# Patient Record
Sex: Female | Born: 1985 | Hispanic: Yes | Marital: Single | Smoking: Never smoker
Health system: Southern US, Community
[De-identification: ages and names within clinical notes are randomized; demographics above are authoritative.]

## PROBLEM LIST (undated history)

## (undated) DIAGNOSIS — D649 Anemia, unspecified: Secondary | ICD-10-CM

## (undated) DIAGNOSIS — Z8759 Personal history of other complications of pregnancy, childbirth and the puerperium: Secondary | ICD-10-CM

## (undated) DIAGNOSIS — Z789 Other specified health status: Secondary | ICD-10-CM

## (undated) HISTORY — DX: Personal history of other complications of pregnancy, childbirth and the puerperium: Z87.59

## (undated) HISTORY — PX: NO PAST SURGERIES: SHX2092

## (undated) HISTORY — DX: Anemia, unspecified: D64.9

---

## 2005-01-18 ENCOUNTER — Ambulatory Visit: Payer: Self-pay | Admitting: Family Medicine

## 2005-05-10 ENCOUNTER — Ambulatory Visit: Payer: Self-pay | Admitting: Family Medicine

## 2005-05-25 ENCOUNTER — Inpatient Hospital Stay: Payer: Self-pay | Admitting: Obstetrics and Gynecology

## 2007-12-03 ENCOUNTER — Inpatient Hospital Stay: Payer: Self-pay

## 2013-07-16 ENCOUNTER — Emergency Department: Payer: Self-pay | Admitting: Emergency Medicine

## 2013-07-16 LAB — URINALYSIS, COMPLETE
BILIRUBIN, UR: NEGATIVE
Bacteria: NONE SEEN
Blood: NEGATIVE
GLUCOSE, UR: NEGATIVE mg/dL (ref 0–75)
Leukocyte Esterase: NEGATIVE
Nitrite: NEGATIVE
PH: 6 (ref 4.5–8.0)
PROTEIN: NEGATIVE
RBC,UR: <1 /HPF (ref 0–5)
SPECIFIC GRAVITY: 1.013 (ref 1.003–1.030)
Squamous Epithelial: <1
WBC UR: 2 /HPF (ref 0–5)

## 2013-07-16 LAB — BASIC METABOLIC PANEL
ANION GAP: 11 (ref 7–16)
BUN: 7 mg/dL (ref 7–18)
CO2: 21 mmol/L (ref 21–32)
Calcium, Total: 8.5 mg/dL (ref 8.5–10.1)
Chloride: 107 mmol/L (ref 98–107)
Creatinine: 0.57 mg/dL — ABNORMAL LOW (ref 0.60–1.30)
EGFR (African American): >60
EGFR (Non-African Amer.): >60
Glucose: 92 mg/dL (ref 65–99)
Osmolality: 275 (ref 275–301)
Potassium: 3.4 mmol/L — ABNORMAL LOW (ref 3.5–5.1)
Sodium: 139 mmol/L (ref 136–145)

## 2013-07-16 LAB — CBC
HCT: 38.1 % (ref 35.0–47.0)
HGB: 12.9 g/dL (ref 12.0–16.0)
MCH: 31 pg (ref 26.0–34.0)
MCHC: 33.9 g/dL (ref 32.0–36.0)
MCV: 91 fL (ref 80–100)
PLATELETS: 246 10*3/uL (ref 150–440)
RBC: 4.17 10*6/uL (ref 3.80–5.20)
RDW: 13.8 % (ref 11.5–14.5)
WBC: 13 10*3/uL — ABNORMAL HIGH (ref 3.6–11.0)

## 2013-07-16 LAB — GC/CHLAMYDIA PROBE AMP

## 2013-07-16 LAB — HCG, QUANTITATIVE, PREGNANCY: Beta Hcg, Quant.: 5035 m[IU]/mL — ABNORMAL HIGH

## 2013-07-16 LAB — WET PREP, GENITAL

## 2013-10-24 ENCOUNTER — Ambulatory Visit: Payer: Self-pay | Admitting: Advanced Practice Midwife

## 2014-03-10 ENCOUNTER — Inpatient Hospital Stay: Payer: Self-pay | Admitting: Obstetrics and Gynecology

## 2014-03-10 LAB — CBC WITH DIFFERENTIAL/PLATELET
Basophil #: 0 10*3/uL (ref 0.0–0.1)
Basophil %: 0.3 %
Eosinophil #: 0 10*3/uL (ref 0.0–0.7)
Eosinophil %: 0.1 %
HCT: 35.4 % (ref 35.0–47.0)
HGB: 11.2 g/dL — ABNORMAL LOW (ref 12.0–16.0)
LYMPHS PCT: 11.9 %
Lymphocyte #: 2 10*3/uL (ref 1.0–3.6)
MCH: 28 pg (ref 26.0–34.0)
MCHC: 31.5 g/dL — AB (ref 32.0–36.0)
MCV: 89 fL (ref 80–100)
Monocyte #: 1 x10 3/mm — ABNORMAL HIGH (ref 0.2–0.9)
Monocyte %: 6.2 %
Neutrophil #: 13.7 10*3/uL — ABNORMAL HIGH (ref 1.4–6.5)
Neutrophil %: 81.5 %
PLATELETS: 245 10*3/uL (ref 150–440)
RBC: 3.99 10*6/uL (ref 3.80–5.20)
RDW: 15.8 % — AB (ref 11.5–14.5)
WBC: 16.9 10*3/uL — ABNORMAL HIGH (ref 3.6–11.0)

## 2014-03-11 LAB — GC/CHLAMYDIA PROBE AMP

## 2014-03-11 LAB — HEMOGLOBIN: HGB: 10.2 g/dL — ABNORMAL LOW (ref 12.0–16.0)

## 2014-08-11 ENCOUNTER — Ambulatory Visit
Admission: RE | Admit: 2014-08-11 | Discharge: 2014-08-11 | Disposition: A | Discharge status: Home / Self Care | Payer: Self-pay | Source: Ambulatory Visit | Attending: Advanced Practice Midwife | Admitting: Advanced Practice Midwife

## 2014-08-11 ENCOUNTER — Other Ambulatory Visit: Payer: Self-pay | Admitting: Advanced Practice Midwife

## 2014-08-11 DIAGNOSIS — Z3481 Encounter for supervision of other normal pregnancy, first trimester: Secondary | ICD-10-CM

## 2014-08-11 DIAGNOSIS — Z36 Encounter for antenatal screening of mother: Secondary | ICD-10-CM | POA: Insufficient documentation

## 2014-08-11 DIAGNOSIS — R935 Abnormal findings on diagnostic imaging of other abdominal regions, including retroperitoneum: Secondary | ICD-10-CM | POA: Insufficient documentation

## 2014-08-11 DIAGNOSIS — Z3A00 Weeks of gestation of pregnancy not specified: Secondary | ICD-10-CM | POA: Insufficient documentation

## 2015-09-29 ENCOUNTER — Other Ambulatory Visit: Payer: Self-pay | Admitting: Family Medicine

## 2015-09-29 DIAGNOSIS — Z3482 Encounter for supervision of other normal pregnancy, second trimester: Secondary | ICD-10-CM

## 2015-09-30 LAB — OB RESULTS CONSOLE GC/CHLAMYDIA: CHLAMYDIA, DNA PROBE: NEGATIVE

## 2015-09-30 LAB — OB RESULTS CONSOLE HEPATITIS B SURFACE ANTIGEN: HEP B S AG: NEGATIVE

## 2015-09-30 LAB — OB RESULTS CONSOLE RPR: RPR: NONREACTIVE

## 2015-09-30 LAB — OB RESULTS CONSOLE HIV ANTIBODY (ROUTINE TESTING): HIV: NONREACTIVE

## 2015-10-26 ENCOUNTER — Ambulatory Visit
Admission: RE | Admit: 2015-10-26 | Discharge: 2015-10-26 | Disposition: A | Discharge status: Home / Self Care | Payer: Self-pay | Source: Ambulatory Visit | Attending: Family Medicine | Admitting: Family Medicine

## 2015-10-26 DIAGNOSIS — Z3A18 18 weeks gestation of pregnancy: Secondary | ICD-10-CM | POA: Insufficient documentation

## 2015-10-26 DIAGNOSIS — Z3482 Encounter for supervision of other normal pregnancy, second trimester: Secondary | ICD-10-CM | POA: Insufficient documentation

## 2016-03-07 NOTE — L&D Delivery Note (Addendum)
Delivery Note  Pt admitted in active labor. AROM for clear fluid at 0648.  At 7:54 AM a viable and healthy female "Clarissa" was delivered via Vaginal, Spontaneous Delivery, in LOA position with   APGAR: 8, 8 ; weight 7 lb 3 oz (3260 g).   Placenta delivered spontaneously. Fundal massage required for firmness and clots extracted from lower uterine segment x3. 800mcg cytotec placed rectally. Final quantitative blood loss >500  Anesthesia:  none Episiotomy:  none Lacerations:  Right periurethral, hemostatic and small Est. Blood Loss (mL): 700  Mom to postpartum.  Baby to Couplet care / Skin to Skin.  Breastfeeding  Ellanor Feuerstein 03/24/2016, 8:12 AM

## 2016-03-24 ENCOUNTER — Inpatient Hospital Stay
Admission: EM | Admit: 2016-03-24 | Discharge: 2016-03-25 | DRG: 774 | Disposition: A | Discharge status: Home / Self Care | Payer: Medicaid Other | Attending: Obstetrics and Gynecology | Admitting: Obstetrics and Gynecology

## 2016-03-24 DIAGNOSIS — D62 Acute posthemorrhagic anemia: Secondary | ICD-10-CM | POA: Diagnosis not present

## 2016-03-24 DIAGNOSIS — Z6790 Unspecified blood type, Rh positive: Secondary | ICD-10-CM

## 2016-03-24 DIAGNOSIS — D649 Anemia, unspecified: Secondary | ICD-10-CM | POA: Diagnosis present

## 2016-03-24 DIAGNOSIS — O9081 Anemia of the puerperium: Secondary | ICD-10-CM | POA: Diagnosis not present

## 2016-03-24 DIAGNOSIS — O99019 Anemia complicating pregnancy, unspecified trimester: Secondary | ICD-10-CM | POA: Diagnosis present

## 2016-03-24 DIAGNOSIS — Z3490 Encounter for supervision of normal pregnancy, unspecified, unspecified trimester: Secondary | ICD-10-CM

## 2016-03-24 DIAGNOSIS — O26893 Other specified pregnancy related conditions, third trimester: Secondary | ICD-10-CM | POA: Diagnosis present

## 2016-03-24 DIAGNOSIS — Z3A39 39 weeks gestation of pregnancy: Secondary | ICD-10-CM

## 2016-03-24 DIAGNOSIS — Z3493 Encounter for supervision of normal pregnancy, unspecified, third trimester: Secondary | ICD-10-CM | POA: Diagnosis present

## 2016-03-24 HISTORY — DX: Other specified health status: Z78.9

## 2016-03-24 LAB — CBC
HCT: 33.7 % — ABNORMAL LOW (ref 35.0–47.0)
Hemoglobin: 11.6 g/dL — ABNORMAL LOW (ref 12.0–16.0)
MCH: 29.5 pg (ref 26.0–34.0)
MCHC: 34.4 g/dL (ref 32.0–36.0)
MCV: 85.7 fL (ref 80.0–100.0)
PLATELETS: 304 10*3/uL (ref 150–440)
RBC: 3.93 MIL/uL (ref 3.80–5.20)
RDW: 16.9 % — AB (ref 11.5–14.5)
WBC: 14 10*3/uL — ABNORMAL HIGH (ref 3.6–11.0)

## 2016-03-24 LAB — TYPE AND SCREEN
ABO/RH(D): O POS
ANTIBODY SCREEN: NEGATIVE

## 2016-03-24 MED ORDER — ACETAMINOPHEN 325 MG PO TABS: 650.0000 mg | ORAL_TABLET | ORAL | Status: DC | PRN

## 2016-03-24 MED ORDER — PRENATAL MULTIVITAMIN CH
1.0000 | ORAL_TABLET | Freq: Every day | ORAL | Status: DC
  Administered 2016-03-24 – 2016-03-25 (×2): 1 via ORAL
  Filled 2016-03-24 (×2): qty 1

## 2016-03-24 MED ORDER — OXYTOCIN 10 UNIT/ML IJ SOLN
INTRAMUSCULAR | Status: AC
  Filled 2016-03-24: qty 2

## 2016-03-24 MED ORDER — DIPHENHYDRAMINE HCL 25 MG PO CAPS
25.0000 mg | ORAL_CAPSULE | Freq: Four times a day (QID) | ORAL | Status: DC | PRN
Start: 2016-03-24 — End: 2016-03-25

## 2016-03-24 MED ORDER — SIMETHICONE 80 MG PO CHEW: 80.0000 mg | CHEWABLE_TABLET | ORAL | Status: DC | PRN

## 2016-03-24 MED ORDER — MISOPROSTOL 200 MCG PO TABS
ORAL_TABLET | ORAL | Status: AC
  Administered 2016-03-24: 800 ug via RECTAL
  Filled 2016-03-24: qty 4

## 2016-03-24 MED ORDER — ZOLPIDEM TARTRATE 5 MG PO TABS: 5.0000 mg | ORAL_TABLET | Freq: Every evening | ORAL | Status: DC | PRN

## 2016-03-24 MED ORDER — METHYLERGONOVINE MALEATE 0.2 MG/ML IJ SOLN
INTRAMUSCULAR | Status: AC
  Filled 2016-03-24: qty 1

## 2016-03-24 MED ORDER — BUTORPHANOL TARTRATE 1 MG/ML IJ SOLN
1.0000 mg | INTRAMUSCULAR | Status: DC | PRN
  Administered 2016-03-24: 1 mg via INTRAVENOUS
  Filled 2016-03-24: qty 1

## 2016-03-24 MED ORDER — FLEET ENEMA 7-19 GM/118ML RE ENEM: 1.0000 | ENEMA | Freq: Every day | RECTAL | Status: DC | PRN

## 2016-03-24 MED ORDER — COCONUT OIL OIL
1.0000 | TOPICAL_OIL | Status: DC | PRN
Start: 2016-03-24 — End: 2016-03-25
  Filled 2016-03-24: qty 120

## 2016-03-24 MED ORDER — OXYCODONE HCL 5 MG PO TABS
5.0000 mg | ORAL_TABLET | ORAL | Status: DC | PRN
  Administered 2016-03-24: 5 mg via ORAL
  Filled 2016-03-24: qty 1

## 2016-03-24 MED ORDER — ONDANSETRON HCL 4 MG PO TABS: 4.0000 mg | ORAL_TABLET | ORAL | Status: DC | PRN

## 2016-03-24 MED ORDER — SODIUM CHLORIDE FLUSH 0.9 % IV SOLN
INTRAVENOUS | Status: AC
  Filled 2016-03-24: qty 10

## 2016-03-24 MED ORDER — SENNOSIDES-DOCUSATE SODIUM 8.6-50 MG PO TABS
2.0000 | ORAL_TABLET | ORAL | Status: DC
  Administered 2016-03-25: 2 via ORAL
  Filled 2016-03-24: qty 2

## 2016-03-24 MED ORDER — AMMONIA AROMATIC IN INHA
RESPIRATORY_TRACT | Status: AC
  Filled 2016-03-24: qty 10

## 2016-03-24 MED ORDER — OXYTOCIN 40 UNITS IN LACTATED RINGERS INFUSION - SIMPLE MED
INTRAVENOUS | Status: AC
  Administered 2016-03-24: 08:00:00
  Filled 2016-03-24: qty 1000

## 2016-03-24 MED ORDER — LACTATED RINGERS IV SOLN
INTRAVENOUS | Status: DC
  Administered 2016-03-24: 06:00:00 via INTRAVENOUS

## 2016-03-24 MED ORDER — LIDOCAINE HCL (PF) 1 % IJ SOLN
INTRAMUSCULAR | Status: AC
  Filled 2016-03-24: qty 30

## 2016-03-24 MED ORDER — DIBUCAINE 1 % RE OINT: 1.0000 "application " | TOPICAL_OINTMENT | RECTAL | Status: DC | PRN

## 2016-03-24 MED ORDER — LACTATED RINGERS IV SOLN: 500.0000 mL | INTRAVENOUS | Status: DC | PRN

## 2016-03-24 MED ORDER — TETANUS-DIPHTH-ACELL PERTUSSIS 5-2.5-18.5 LF-MCG/0.5 IM SUSP: 0.5000 mL | Freq: Once | INTRAMUSCULAR | Status: DC

## 2016-03-24 MED ORDER — SODIUM CHLORIDE 0.9% FLUSH: 3.0000 mL | INTRAVENOUS | Status: DC | PRN

## 2016-03-24 MED ORDER — BENZOCAINE-MENTHOL 20-0.5 % EX AERO: 1.0000 "application " | INHALATION_SPRAY | CUTANEOUS | Status: DC | PRN

## 2016-03-24 MED ORDER — MEASLES, MUMPS & RUBELLA VAC ~~LOC~~ INJ
0.5000 mL | INJECTION | Freq: Once | SUBCUTANEOUS | Status: DC
  Filled 2016-03-24 (×2): qty 0.5

## 2016-03-24 MED ORDER — SODIUM CHLORIDE 0.9 % IV SOLN: 250.0000 mL | INTRAVENOUS | Status: DC | PRN

## 2016-03-24 MED ORDER — ONDANSETRON HCL 4 MG/2ML IJ SOLN: 4.0000 mg | INTRAMUSCULAR | Status: DC | PRN

## 2016-03-24 MED ORDER — MISOPROSTOL 200 MCG PO TABS
800.0000 ug | ORAL_TABLET | Freq: Once | ORAL | Status: AC
  Administered 2016-03-24: 800 ug via RECTAL

## 2016-03-24 MED ORDER — IBUPROFEN 600 MG PO TABS
600.0000 mg | ORAL_TABLET | Freq: Four times a day (QID) | ORAL | Status: DC
  Administered 2016-03-24 – 2016-03-25 (×4): 600 mg via ORAL
  Filled 2016-03-24 (×4): qty 1

## 2016-03-24 MED ORDER — SODIUM CHLORIDE 0.9% FLUSH: 3.0000 mL | Freq: Two times a day (BID) | INTRAVENOUS | Status: DC

## 2016-03-24 MED ORDER — WITCH HAZEL-GLYCERIN EX PADS: 1.0000 "application " | MEDICATED_PAD | CUTANEOUS | Status: DC | PRN

## 2016-03-24 MED ORDER — BISACODYL 10 MG RE SUPP: 10.0000 mg | Freq: Every day | RECTAL | Status: DC | PRN

## 2016-03-24 NOTE — H&P (Signed)
OB ADMISSION/ HISTORY & PHYSICAL:  Admission Date: 03/24/2016  2:52 AM  Admit Diagnosis: Contractions  Kaitlin Hanson is a 31 y.o. [id number] at 3439+4wks female presenting for contractions at term. Baby girl "Clarissa"- contractions started at 0100  Prenatal History: [id number]   EDC : 03/27/2016, by Patient Reported  Prenatal care at [facility] Provider: [facility] Prenatal course complicated by Anemia, late entry to care  Prenatal Labs: ABO, Rh: --/--/PENDING (01/18 19140611) - O positive Antibody: PENDING (01/18 [id number]) Negative Rubella:   immune RPR: Nonreactive (07/26 0000)  HBsAg: Negative (07/26 0000)  HIV: Non-reactive (07/26 0000)  GBS:   negative Medical / Surgical History :  Past medical history:  Past Medical History:  Diagnosis Date  . Medical history non-contributory      Past surgical history: History reviewed. No pertinent surgical history.  Family History: History reviewed. No pertinent family history.   Social History:  reports that she has never smoked. She has never used smokeless tobacco. She reports that she does not drink alcohol or use drugs.   Allergies: Patient has no allergy information on record.    Current Medications at time of admission:  Prior to Admission medications   Medication Sig Start Date End Date Taking? Authorizing Provider  ferrous sulfate 325 (65 FE) MG EC tablet Take 325 mg by mouth 3 (three) times daily with meals.   Yes Historical Provider, MD  Prenatal Vit-Fe Fumarate-FA (PRENATAL MULTIVITAMIN) TABS tablet Take 1 tablet by mouth daily at 12 noon.   Yes Historical Provider, MD     Review of Systems: Active FM onset of ctx @ 0100 currently every 2-4 minutes AROM at 0645 for scant bloody clear fluid bloody show    Physical Exam:  VS: Blood pressure 122/83, pulse 91, temperature 97.9 F (36.6 C), temperature source Oral, resp. rate 20, height 5\' 4"  (1.626 m), weight 172 lb (78 kg).  General: alert and oriented, appears  NAD Heart: RRR Lungs: Clear lung fields Abdomen: Gravid, soft and non-tender, non-distended / uterus: 7.5lbs Extremities: no edema  Genitalia / VE: Dilation: 6 Effacement (%): 80 Station: -2, -1 Exam by:: Cianni Manny   FHR: baseline rate 140 / variability moderate / accelerations positive / negative decelerations TOCO: q2-414min  Last US 18wks wks anatomy scan: AF wnl, normal anatomy  Assessment: [redacted] weeks gestation first stage of labor FHR category I   Plan:    ASSESSMENT AND PLAN:  Admit for active labor Labs pending Epidural when desired Continuous fetal monitoring   1. Fetal Well being  - Fetal Tracing: Cat I - Ultrasound:  reviewed, as above - Group B Streptococcus: neg - Presentation: vtx confirmed by sutures   2. Routine OB: - Prenatal labs reviewed, as above - Rh positive  3.  Labor:  -  Contractions; external toco in place -  Pelvis proven to 7LBS  4. Post Partum Planning: - Infant feeding: BOTH

## 2016-03-24 NOTE — OB Triage Note (Signed)
Patient came in today c/o contractions. Reports that contractions began 2 days ago but starting at 1am today contractions were stronger and more consistent. Denies vaginal discharge and vaginal bleeding.

## 2016-03-24 NOTE — Progress Notes (Signed)
Dr. Dalbert GarnetBeasley paged at 0354 to give patient triage report. Verbal orders given to recheck vaginal exam in 2 hours (5:20am) and for patient to ambulate. Intermittent fetal monitoring permission given while ambulating. Patient given plan of care and verbalized understatement/agreement. Patient currently off the monitor and will reapply at 0500.

## 2016-03-25 DIAGNOSIS — O99019 Anemia complicating pregnancy, unspecified trimester: Secondary | ICD-10-CM | POA: Diagnosis present

## 2016-03-25 LAB — CBC
HCT: 29.7 % — ABNORMAL LOW (ref 35.0–47.0)
HEMOGLOBIN: 9.7 g/dL — AB (ref 12.0–16.0)
MCH: 28.1 pg (ref 26.0–34.0)
MCHC: 32.8 g/dL (ref 32.0–36.0)
MCV: 85.8 fL (ref 80.0–100.0)
Platelets: 265 10*3/uL (ref 150–440)
RBC: 3.46 MIL/uL — AB (ref 3.80–5.20)
RDW: 16.4 % — ABNORMAL HIGH (ref 11.5–14.5)
WBC: 11.8 10*3/uL — ABNORMAL HIGH (ref 3.6–11.0)

## 2016-03-25 LAB — RPR: RPR: NONREACTIVE

## 2016-03-25 MED ORDER — IBUPROFEN 600 MG PO TABS: 600.0000 mg | ORAL_TABLET | Freq: Four times a day (QID) | ORAL | 0 refills | Status: DC

## 2016-03-25 NOTE — Discharge Instructions (Signed)
Lactancia materna (Breastfeeding) Decidir Museum/gallery exhibitions officer es una de las mejores elecciones que puede hacer por usted y su beb. El cambio hormonal durante el Psychiatrist produce el desarrollo del tejido mamario y [place] la cantidad y el tamao de los conductos galactforos. Estas hormonas tambin permiten que las protenas, los azcares y las grasas de la sangre produzcan la [employer] materna en las glndulas productoras de [place]. Las hormonas impiden que la leche materna sea liberada antes del nacimiento del beb, adems de impulsar el flujo de leche luego del nacimiento. Una vez que ha comenzado a Museum/gallery exhibitions officer, Conservation officer, nature beb, as Immunologist succin o Theatre manager, pueden estimular la liberacin de [place] de las glndulas productoras de [place]. LOS BENEFICIOS DE AMAMANTAR Para el beb   La primera leche (calostro) ayuda a Careers information officer funcionamiento del sistema digestivo del beb.  La leche tiene anticuerpos que ayudan a Radio producer las infecciones en el beb.  El beb tiene una menor incidencia de asma, alergias y del sndrome de muerte sbita del lactante.  Los nutrientes en la [place] materna son mejores para el beb que la Lyman maternizada y estn preparados exclusivamente para cubrir las necesidades del beb.  La leche materna mejora el desarrollo cerebral del beb.  Es menos probable que el beb desarrolle otras enfermedades, como obesidad infantil, asma o diabetes mellitus de tipo 2. Para usted   La lactancia materna favorece el desarrollo de un vnculo muy especial entre la madre y el beb.  Es conveniente. La leche materna siempre est disponible a la Human resources officer y es [place].  La lactancia materna ayuda a quemar caloras y a perder el peso ganado durante el [place].  Favorece la contraccin del tero al tamao que tena antes del embarazo de manera ms rpida y disminuye el sangrado (loquios) despus del parto.  La lactancia materna contribuye a reducir Nurse, adult de desarrollar diabetes  mellitus de tipo 2, osteoporosis o cncer de mama o de ovario en el futuro. SIGNOS DE QUE EL BEB EST HAMBRIENTO Primeros signos de [address] de [place].  Se estira.  Mueve la cabeza de un lado a otro.  Mueve la cabeza y abre la boca cuando se le toca la mejilla o la comisura de la boca (reflejo de bsqueda).  Aumenta las vocalizaciones, tales como sonidos de succin, se relame los labios, emite arrullos, suspiros, o chirridos.  Mueve la [employer] boca.  Se chupa con ganas los dedos o las manos. Signos tardos de [employer].  Llora de manera intermitente. Signos de [employer] signos de hambre extrema requerirn que lo calme y lo consuele antes de que el beb pueda alimentarse adecuadamente. No espere a que se manifiesten los siguientes signos de hambre extrema para comenzar a Museum/gallery exhibitions officer:  Designer, jewellery.  Llanto intenso y fuerte.  Gritos. INFORMACIN BSICA SOBRE LA LACTANCIA MATERNA Iniciacin de la lactancia materna   Encuentre un lugar cmodo para sentarse o acostarse, con un buen respaldo para el cuello y la espalda.  Coloque una almohada o una manta enrollada debajo del beb para acomodarlo a la altura de la mama (si est sentada). Las almohadas para Museum/gallery exhibitions officer se han diseado especialmente a fin de servir de apoyo para los brazos y el beb [employer].  Asegrese de que el abdomen del beb est frente al suyo.  Masajee suavemente la mama. Con las yemas de los dedos, masajee la pared del pecho hacia el pezn en un  movimiento circular. Esto estimula el flujo de [place]. Es posible que Engineer, manufacturing systemsdeba continuar este movimiento mientras amamanta si la leche fluye lentamente.  Sostenga la mama con el pulgar por arriba del pezn y los otros 4 dedos por debajo de la mama. Asegrese de que los dedos se encuentren lejos del pezn y de la boca del beb.  Empuje suavemente los labios del beb con el pezn o con el dedo.  Cuando la boca  del beb se abra lo suficiente, acrquelo rpidamente a la mama e introduzca todo el pezn y la zona oscura que lo rodea (areola), tanto como sea posible, dentro de la boca del beb.  Debe haber ms areola visible por arriba del labio superior del beb que por debajo del labio inferior.  La lengua del beb debe estar entre la enca inferior y la [place].  Asegrese de que la boca del beb est en la posicin correcta alrededor del pezn (prendida). Los labios del beb deben crear un sello sobre la mama y estar doblados hacia afuera (invertidos).  Es comn que el beb succione durante 2 a 3 minutos para que comience el flujo de [place] materna. Cmo debe prenderse  Es muy importante que le ensee al beb cmo prenderse adecuadamente a la mama. Si el beb no se prende adecuadamente, puede causarle dolor en el pezn y reducir la produccin de [place] materna, y hacer que el beb tenga un escaso aumento de [place] Komelikpeso. Adems, si el beb no se prende adecuadamente al pezn, puede tragar aire durante la alimentacin. Esto puede causarle molestias al beb. Hacer eructar al beb al Pilar Platecambiar de mama puede ayudarlo a liberar el aire. Sin embargo, ensearle al beb cmo prenderse a la mama adecuadamente es la mejor manera de evitar que se sienta molesto por tragar Oceanographeraire mientras se alimenta. Signos de que el beb se ha prendido adecuadamente al pezn:  Payton Doughtyironea o succiona de modo silencioso, sin causarle dolor.  Se escucha que traga cada 3 o 4 succiones.  Hay movimientos musculares por arriba y por delante de sus odos al Printmakersuccionar. Signos de que el beb no se ha prendido Audiological scientistadecuadamente al pezn:  Hace ruidos de succin o de chasquido mientras se alimenta.  Siente dolor en el pezn. Si cree que el beb no se prendi correctamente, deslice el dedo en la comisura de la boca y Ameren Corporationcolquelo entre las encas del beb para interrumpir la succin. Intente comenzar a amamantar nuevamente. Signos de Fish farm managerlactancia materna exitosa    Signos del beb:  Disminuye gradualmente el nmero de succiones o cesa la succin por completo.  Se duerme.  Relaja el cuerpo.  Retiene una pequea cantidad de Kindred Healthcareleche en la boca.  Se desprende solo del pecho. Signos que presenta usted:  Las mamas han aumentado la firmeza, el peso y el tamao 1 a 3 horas despus de Museum/gallery exhibitions officeramamantar.  Estn ms blandas inmediatamente despus de amamantar.  Un aumento del volumen de [place], y tambin un cambio en su consistencia y color se producen hacia el quinto da de Tour managerlactancia materna.  Los pezones no duelen, ni estn agrietados ni sangran. Signos de que su beb recibe la cantidad de leche suficiente   Mojar por lo menos 1 o 2 paales durante las primeras 24 horas despus del nacimiento.  Mojar por lo menos 5 o 6 paales cada 24 horas durante la primera semana despus del nacimiento. La orina debe ser transparente o de color amarillo plido a los 5 das despus del nacimiento.  Mojar Eusebio Meentre  6 y 8 paales cada 24 horas a medida que el beb sigue creciendo y desarrollndose.  Defeca al menos 3 veces en 24 horas a los [address] de [address] La materia fecal debe ser blanda y [place].  Defeca al menos 3 veces en 24 horas a los [address] de [address] La materia fecal debe ser grumosa y [place].  No registra una prdida de peso mayor del 10% del peso al nacer durante los primeros 3 [address]  [address] de [place].  Aumenta de peso un promedio de 4 a 7onzas (113 a 198g) por semana despus de los 4 [address] de vida.  Aumenta de [place], [place], de [place] uniforme a Glass blower/designer de los 5 [address] de vida, sin Passenger transport manager prdida de peso despus de las 2semanas de vida. Despus de alimentarse, es posible que el beb regurgite una pequea cantidad. Esto es frecuente. FRECUENCIA Y DURACIN DE LA LACTANCIA MATERNA El amamantamiento frecuente la ayudar a producir ms [place] y a Education officer, community de Engineer, mining en los pezones e hinchazn en las Privateer. Alimente al beb cuando muestre signos de hambre o si  siente la necesidad de reducir la congestin de las [place]. Esto se denomina "lactancia a demanda". Evite el uso del chupete mientras trabaja para establecer la lactancia (las primeras 4 a 6 semanas despus del nacimiento del beb). Despus de este perodo, podr ofrecerle un chupete. Las investigaciones demostraron que el uso del chupete durante el primer ao de vida del beb disminuye el riesgo de desarrollar el sndrome de muerte sbita del lactante (SMSL). Permita que el nio se alimente en cada mama todo lo que desee. Contine amamantando al beb hasta que haya terminado de alimentarse. Cuando el beb se desprende o se queda dormido mientras se est alimentando de la primera mama, ofrzcale la segunda. Debido a que, con frecuencia, los recin [place] las primeras semanas de vida, es posible que deba despertar al beb para alimentarlo. Los horarios de Acupuncturist de un beb a otro. Sin embargo, las siguientes reglas pueden servir como gua para ayudarla a Lawyer que el beb se alimenta adecuadamente:  Se puede amamantar a los recin nacidos (bebs de 4 semanas o menos de vida) cada 1 a 3 horas.  No deben transcurrir ms de 3 horas durante el da o 5 horas durante la noche sin que se amamante a los recin nacidos.  Debe amamantar al beb 8 veces como mnimo en un perodo de 24 horas, hasta que comience a introducir slidos en su dieta, a los 6 meses de vida aproximadamente. EXTRACCIN DE [employer] MATERNA La extraccin y Contractor de la leche materna le permiten asegurarse de que el beb se alimente exclusivamente de [place], aun en momentos en los que no puede amamantar. Esto tiene especial importancia si debe regresar al Aleen Campi en el perodo en que an est amamantando o si no puede estar presente en los momentos en que el beb debe alimentarse. Su asesor en lactancia puede orientarla sobre cunto tiempo es seguro almacenar [place]. El sacaleche es un  aparato que le permite extraer leche de la mama a un recipiente estril. Luego, la leche materna extrada puede almacenarse en un refrigerador o Electrical engineer. Algunos sacaleches son Birdie Riddle, Delaney Meigs otros son elctricos. Consulte a su asesor en lactancia qu tipo ser ms conveniente para usted. Los sacaleches se pueden comprar; sin embargo, algunos hospitales y grupos de apoyo a la lactancia materna alquilan Sports coach. Un asesor en lactancia puede ensearle cmo extraer [employer],  en caso de que prefiera no usar Buyer, retail. CMO CUIDAR LAS MAMAS DURANTE LA LACTANCIA MATERNA Los pezones se secan, agrietan y duelen durante la Tour manager. Las siguientes recomendaciones pueden ayudarla a Pharmacologist las [employer] y sanas:  Careers information officer usar jabn en los pezones.  Use un sostn de soporte. Aunque no son esenciales, las camisetas sin mangas o los sostenes especiales para Museum/gallery exhibitions officer estn diseados para acceder fcilmente a las mamas, para Museum/gallery exhibitions officer sin tener que quitarse todo el sostn o la camiseta. Evite usar sostenes con aro o sostenes muy ajustados.  Seque al aire sus pezones durante 3 a despus de amamantar al beb.  Utilice solo apsitos de Haematologist sostn para Environmental health practitioner las prdidas de [place]. La prdida de un poco de [employer] tomas es normal.  Utilice lanolina sobre los pezones luego de Museum/gallery exhibitions officer. La lanolina ayuda a mantener la humedad normal de la piel. Si [place] lanolina pura, no tiene que lavarse los pezones antes de volver a Corporate treasurer al beb. La lanolina pura no es txica para el beb. Adems, puede extraer [employer] algunas gotas de [place] materna y Engineer, maintenance (IT) suavemente esa [employer], para que la [place] se seque al aire. Durante las primeras semanas despus de dar a luz, algunas mujeres pueden experimentar hinchazn en las mamas (congestin [place]). La congestin puede hacer que sienta las mamas pesadas, calientes y  sensibles al tacto. El pico de la congestin ocurre dentro de los 3 a 5 das despus del [place]. Las siguientes recomendaciones pueden ayudarla a Paramedic la congestin:  Vace por completo las mamas al [employer] o Environmental health practitioner. Puede aplicar calor hmedo en las mamas (en la ducha o con toallas hmedas para manos) antes de Museum/gallery exhibitions officer o extraer [employer]. Esto aumenta la circulacin y [place] a que la [place]. Si el beb no vaca por completo las [address] cuando lo [address], extraiga la [place] restante despus de que haya finalizado.  Use un sostn ajustado (para amamantar o comn) o una camiseta sin mangas durante 1 o 2 das para indicar al cuerpo que disminuya ligeramente la produccin de [place].  Aplique compresas de hielo [employer], a menos que le resulte demasiado incmodo.  Asegrese de que el beb est prendido y se encuentre en la posicin correcta mientras lo alimenta. Si la congestin persiste luego de 48 horas o despus de seguir estas recomendaciones, comunquese con su mdico o un Holiday representative. RECOMENDACIONES GENERALES PARA EL CUIDADO DE LA SALUD DURANTE LA LACTANCIA MATERNA  Consuma alimentos saludables. Alterne comidas y colaciones, y coma 3 de cada una por da. Dado que lo que come [employer], es posible que algunas comidas hagan que su beb se vuelva ms irritable de lo habitual. Evite comer este tipo de alimentos si percibe que afectan de manera negativa al beb.  Beba leche, jugos de fruta y agua para Patent examiner su sed (aproximadamente 10 vasos al Futures trader).  Descanse con frecuencia, reljese y tome sus vitaminas prenatales para evitar la fatiga, el estrs y la anemia.  Contine con los autocontroles de la mama.  Evite Product manager y fumar tabaco. Las sustancias qumicas de los cigarrillos que pasan a la leche materna y la exposicin al humo ambiental del tabaco pueden daar al beb.  No consuma alcohol ni drogas, incluida la marihuana. Algunos medicamentos, que  pueden ser perjudiciales para el beb, pueden pasar a travs de la [employer]. Es importante que consulte a su mdico antes de Health visitor  medicamento, incluidos todos los medicamentos recetados y de [place], as como los suplementos vitamnicos y herbales. Puede quedar embarazada durante la lactancia. Si desea controlar la natalidad, consulte a su mdico cules son las opciones ms seguras para el beb. SOLICITE ATENCIN MDICA SI:  Usted siente que quiere dejar de Museum/gallery exhibitions officer o se siente frustrada con la lactancia.  Siente dolor en las mamas o en los pezones.  Sus pezones estn agrietados o Water quality scientist.  Sus pechos estn irritados, sensibles o calientes.  Tiene un rea hinchada en cualquiera de las mamas.  Siente escalofros o fiebre.  Tiene nuseas o vmitos.  Presenta una secrecin de otro lquido distinto de la leche materna de los pezones.  Sus mamas no se llenan antes de Museum/gallery exhibitions officer al beb para el quinto da despus del [place].  Se siente triste y deprimida.  El beb est demasiado somnoliento como para comer bien.  El beb tiene problemas para dormir.  Moja menos de 3 paales en 24 horas.  Defeca menos de 3 veces en 24 horas.  La piel del beb o la parte blanca de los ojos se vuelven amarillentas.  El beb no ha aumentado de [place] a los [address] de [place]. SOLICITE ATENCIN MDICA DE INMEDIATO SI:  El beb est muy cansado Retail buyer) y no se quiere despertar para comer.  Le sube la fiebre sin causa. Esta informacin no tiene Theme park manager el consejo del mdico. Asegrese de hacerle al mdico cualquier pregunta que tenga. Document Released: 02/21/2005 Document Revised: 06/15/2015 Document Reviewed: 08/15/2012 Elsevier Interactive Patient Education  2017 [employer].  Parto vaginal, cuidados de puerperio (Postpartum Care After Vaginal Delivery) El perodo de tiempo que sigue inmediatamente al parto se conoce como puerperio. QU TIPO DE ATENCIN MDICA  RECIBIR?  Podra continuar recibiendo medicamentos y lquidos travs de una va intravenosa (IV) que se Scientific laboratory technician en una de sus venas.  Si se le realiz una incisin cerca de la vagina (episiotoma) o si ha tenido Airline pilot parto, podran indicarle que se coloque compresas fras sobre la episiotoma o Art therapist. Esto ayuda a Engineer, materials y la hinchazn.  Es posible que le den una botella rociadora para que use cuando vaya al bao. Puede utilizarla hasta que se sienta cmoda limpindose de la manera habitual. Siga los pasos a continuacin para usar la botella rociadora:  Antes de orinar, llene la botella rociadora con agua tibia. No use agua caliente.  Despus de Geographical information systems officer, [place] an est sentada en el inodoro, use la botella rociadora para enjuagar el rea alrededor de la uretra y la abertura vaginal. Con esto podr limpiar cualquier rastro de orina y [place].  Puede hacer esto en lugar de secarse. Cuando comience a Barrister's clerk, podr usar la botella rociadora antes de secarse. Asegrese de secarse suavemente.  Llene la botella rociadora con agua limpia cada vez que vaya al bao.  Deber usar apsitos sanitarios. CMO PUEDO SENTIRME?  Quizs no tenga necesidad de orinar durante varias horas despus del parto.  Sentir algo de dolor y Associate Professor en el abdomen y la vagina.  Si est amamantando, podra tener contracciones uterinas cada vez que lo haga. Estas podran prolongarse hasta varias semanas durante el puerperio. Las contracciones uterinas ayudan al tero a Hotel manager a su tamao habitual.  Es normal tener un poco de hemorragia vaginal (loquios) despus del [place]. La cantidad y apariencia de los loquios a menudo es similar a las del perodo menstrual la primera semana despus del [place]. Disminuir gradualmente  las siguientes semanas hasta convertirse en una descarga seca amarronada o amarillenta. En la [employer] Corporation, los loquios se detienen [employer] Life Insurance 6 a  8semanas despus del [place]. Los sangrados vaginales pueden variar de mujer a Nurse, learning disability.  Los primeros [address] despus del parto, podra padecer [place]. Los pechos se sentirn pesados, llenos y molestos. Las mamas tambin podran latir y ponerse duras, muy tirantes, calientes y sensibles al tacto. Cuando esto [place], podra notar Mirant se escapa de los senos.El mdico puede recomendarle algunos mtodos para Emergency planning/management officer causado por la [place]. La congestin mamaria debera desaparecer al cabo de [employer] Mutual [employer] Omaha.  Podra sentirse ms deprimida o preocupada que lo habitual debido a los cambios hormonales luego del [place]. Estos sentimientos no deben durar ms de [employer]. Si no desaparecen al cabo de Time [employer], hable con su mdico. QU CUIDADOS DEBO TENER?  Infrmele a su mdico si siente dolor o malestar.  Beba suficiente agua para mantener la orina clara o de color amarillo plido.  Lvese bien las manos con agua y jabn durante al menos 20segundos despus de cambiar el apsito sanitario, usar el bao o antes de sostener o Corporate treasurer al beb.  Si no est amamantando, evite tocarse mucho los senos. Al hacerlo, podran producir ms [employer].  Si se siente dbil o mareada, o si siente que est a punto de [address], pida ayuda antes de realizar lo siguiente:  Levantarse de la cama.  Ducharse.  Cambie los apsitos sanitarios con frecuencia. Observe si hay cambios en el flujo, como un aumento repentino en el volumen, cambios en el color o cogulos sanguneos de gran tamao. Si expulsa un cogulo sanguneo por la vagina, gurdelo para mostrrselo a su mdico. No tire la cadena sin que el mdico examine el cogulo antes.  Asegrese de tener todas las vacunas al da. Esto la ayudar a Theme park manager protegida y a proteger al beb de determinadas enfermedades. Podra necesitar vacunas antes de dejar el hospital.  Si lo desea, hable con el mdico acerca de los mtodos de  planificacin familiar o control de la natalidad (mtodos anticonceptivos). CMO PUEDO ESTABLECER LAZOS CON MI BEB? Pasar tanto tiempo como le sea posible con el beb es sumamente importante. Durante ese tiempo, usted y su beb pueden conocerse y Theme park manager. Tener al beb con usted en la habitacin le dar tiempo de conocerlo. Esto tambin puede hacerla sentir ms cmoda para atender al beb. Amamantar tambin puede ayudarla a crear lazos con el beb. CMO PUEDO PLANIFICAR MI REGRESO A CASA CON EL BEB?  Asegrese de tener instalada una butaca en el automvil.  La butaca debe contar con la certificacin del fabricante para asegurarse de que est instalada en forma segura.  Asegrese de que el beb quede bien asegurado en la butaca.  Pregntele al mdico todo lo que necesite saber sobre los cuidados de su beb. Asegrese de poder comunicarse con el mdico en caso de que tenga preguntas luego de dejar el hospital. Esta informacin no tiene Theme park manager el consejo del mdico. Asegrese de hacerle al mdico cualquier pregunta que tenga. Document Released: 12/19/2006 Document Revised: 06/15/2015 Document Reviewed: 01/26/2015 Elsevier Interactive Patient Education  2017 [employer].

## 2016-03-25 NOTE — Progress Notes (Signed)
Both parents viewed the Period of Purple Cry prior to discharge.

## 2016-03-25 NOTE — Discharge Summary (Signed)
Obstetrical Discharge Summary  Patient Name: Kaitlin Hanson DOB: 1986/02/08 MRN: [id number]  Date of Admission: 03/24/2016 Date of Discharge: 03/25/2016  Primary OB: [facility]  Gestational Age at Delivery: [id number]   Antepartum complications: Anemia, late entry to care Admitting Diagnosis: Active labor at term  Secondary Diagnosis: Patient Active Problem List   Diagnosis Date Noted  . Anemia in pregnancy 03/25/2016  . Pregnancy 03/24/2016    Augmentation: AROM Complications: Hemorrhage>73400mL Intrapartum complications/course: Third stage: Fundal massage required for firmness and clots extracted from lower uterine segment x3. 800mcg cytotec placed rectally. Final quantitative blood loss >500 Date of Delivery: 03/24/16 Delivered By: Christeen DouglasBethany Beasley  Delivery Type: spontaneous vaginal delivery Anesthesia: none Placenta: sponatneous Laceration:  Right periurethral, hemostatic and small Episiotomy: none Newborn Data: Live born female  Birth Weight: 7 lb 3 oz (3260 g) APGAR: 8, 8    Postpartum Procedures: Fundal massage required for firmness and clots extracted from lower uterine segment x3. 800mcg cytotec placed rectally. Final quantitative blood loss >500  Post partum course: 03/25/2016 Patient's postpartum course was complicated by ABL anemia s/p postpartum hemorrhage.  She is asymptomatic.  By time of discharge on PPD#1 , her pain was controlled on oral pain medications; she had appropriate lochia and was ambulating, voiding without difficulty and tolerating regular diet.  She was deemed stable for discharge to home.     Discharge Physical Exam: 03/25/16 BP 102/64 (BP Location: Left Arm)   Pulse 78   Temp 98.6 F (37 C) (Axillary)   Resp 18   Ht 5\' 4"  (1.626 m)   Wt 78 kg (172 lb)   SpO2 98%   Breastfeeding? Unknown   BMI 29.52 kg/m   General: NAD CV: RRR Pulm: CTABL, nl effort ABD: s/nd/nt, fundus firm and 2 below the umbilicus Lochia: moderate Perineum:  well approximated right periurethral, no significant edema or erythema  DVT Evaluation: LE non-ttp, no evidence of DVT on exam.  Hemoglobin  Date Value Ref Range Status  03/25/2016 9.7 (L) 12.0 - 16.0 g/dL Final   HGB  Date Value Ref Range Status  03/11/2014 10.2 (L) 12.0 - 16.0 g/dL Final   HCT  Date Value Ref Range Status  03/25/2016 29.7 (L) 35.0 - 47.0 % Final  03/10/2014 35.4 35.0 - 47.0 % Final     Disposition: stable, discharge to home. Baby Feeding: breastmilk and formula Baby Disposition: home with mom  Rh Immune globulin given: N/A Rubella vaccine given: N/S Tdap vaccine given in AP or PP setting: UTD Flu vaccine given in AP or PP setting: UTD  Contraception: Mirena IUD  Prenatal Labs:  Prenatal Labs: ABO, Rh: --/--/PENDING (01/18 [id number]) - O positive Antibody: PENDING (01/18 [id number]) Negative Rubella:   immune RPR: Nonreactive (07/26 0000)  HBsAg: Negative (07/26 0000)  HIV: Non-reactive (07/26 0000)  GBS:   negative   Plan:  Kaitlin Hanson was discharged to home in good condition. Follow-up appointment at [facility] in 6 weeks.  Would like Mirena IUD.   Discharge Medications: Allergies as of 03/25/2016   Not on File     Medication List    TAKE these medications   ferrous sulfate 325 (65 FE) MG EC tablet Take 325 mg by mouth 3 (three) times daily with meals.   ibuprofen 600 MG tablet Commonly known as:  ADVIL,MOTRIN Take 1 tablet (600 mg total) by mouth every 6 (six) hours.   prenatal multivitamin Tabs tablet Take 1 tablet by mouth daily at 12 noon.  Follow-up Information    [facility] Department. Schedule an appointment as soon as possible for a visit in 6 week(s).   Why:  Postpartum Visit and wants Mirena IUD  Contact information: [address] [place] [address] [phone]           Signed:  Carlean Jews, CNM

## 2017-03-07 NOTE — L&D Delivery Note (Signed)
VAGINAL DELIVERY NOTE:  Date of Delivery: 07/20/2017 Primary OB: [facility]  Gestational Age/EDD: [redacted]w[redacted]d 07/24/2017, by [facility] records Antepartum complications: Late PNC, Obesity Attending Physician: CJones, CNM/Beasley as Back up Delivery Type: NSVD of viable female at [phone] Anesthesia: Nitrous Oxide for cervical repair Laceration: None Episiotomy: None Placenta:Intact at [phone] and no clots seen Intrapartum complications: Microscopic oozing of bleeders on cx sutured x 5 stitches and then cauterized with Silver Nitrate.  Estimated Blood Loss: 310 mls GBS: Neg Procedure Details: In room evaluting pt and she had the urge to push. Pt placed in Lithotomy pos at 0755 and began to push with NICU RN in room and RN. AROM had occurred at 0733am for clear fluid Pt pushed with SVD of healthy female with initial cry and active. CCx2 and cut per dad. Cord blood collected. SDOP intact with central insertion of the cord and no missing cotyledons. Pt initially had minimal bleeding but, was boggy and taking time to firm up with IV Pitocin and uterine massage. Perineum was intact. After the delivery and leaving the room, I was called back for continuous bleeding even though pt received 800 mg of Cytotec due to boggy uterus. The vagina and perineum were inspected and the uterus was firm. A right angle retractor was used to visualize the cx. It appeared there were microscopic bleeders and 3-0 CH on CT used x 5 sutures to repair tiny oozy areas. Silver nitrate was used to cauterize the cx and hemostasis achieved. Pt bonding with baby. VSS, Needle ct x 2 and correctly removed from table. Sponge ct 20 and correct and removed from table.   Baby: Liveborn Female, Apgars 8-9/, weight 8#, 2oz, baby named "*Environmental manager"  _____________________________ Myrtie Cruise, MSN, CNM, FNP Certified Nurse Midwife [facility]

## 2017-03-20 ENCOUNTER — Encounter: Payer: Self-pay | Admitting: Emergency Medicine

## 2017-03-20 ENCOUNTER — Other Ambulatory Visit: Payer: Self-pay

## 2017-03-20 ENCOUNTER — Emergency Department
Admission: EM | Admit: 2017-03-20 | Discharge: 2017-03-20 | Disposition: A | Discharge status: Home / Self Care | Payer: Self-pay | Attending: Emergency Medicine | Admitting: Emergency Medicine

## 2017-03-20 ENCOUNTER — Emergency Department: Payer: Self-pay

## 2017-03-20 DIAGNOSIS — O4692 Antepartum hemorrhage, unspecified, second trimester: Secondary | ICD-10-CM | POA: Insufficient documentation

## 2017-03-20 DIAGNOSIS — O4690 Antepartum hemorrhage, unspecified, unspecified trimester: Secondary | ICD-10-CM

## 2017-03-20 DIAGNOSIS — Z3492 Encounter for supervision of normal pregnancy, unspecified, second trimester: Secondary | ICD-10-CM

## 2017-03-20 DIAGNOSIS — N939 Abnormal uterine and vaginal bleeding, unspecified: Secondary | ICD-10-CM

## 2017-03-20 DIAGNOSIS — Z79899 Other long term (current) drug therapy: Secondary | ICD-10-CM | POA: Insufficient documentation

## 2017-03-20 DIAGNOSIS — Z3A22 22 weeks gestation of pregnancy: Secondary | ICD-10-CM | POA: Insufficient documentation

## 2017-03-20 DIAGNOSIS — R102 Pelvic and perineal pain: Secondary | ICD-10-CM | POA: Insufficient documentation

## 2017-03-20 LAB — COMPREHENSIVE METABOLIC PANEL
ALT: 19 U/L (ref 14–54)
AST: 25 U/L (ref 15–41)
Albumin: 3.3 g/dL — ABNORMAL LOW (ref 3.5–5.0)
Alkaline Phosphatase: 91 U/L (ref 38–126)
Anion gap: 9 (ref 5–15)
BUN: 9 mg/dL (ref 6–20)
CHLORIDE: 105 mmol/L (ref 101–111)
CO2: 22 mmol/L (ref 22–32)
Calcium: 8.8 mg/dL — ABNORMAL LOW (ref 8.9–10.3)
Creatinine, Ser: 0.58 mg/dL (ref 0.44–1.00)
GFR calc Af Amer: >60 mL/min (ref >60)
GFR calc non Af Amer: >60 mL/min (ref >60)
Glucose, Bld: 84 mg/dL (ref 65–99)
POTASSIUM: 3.5 mmol/L (ref 3.5–5.1)
Sodium: 136 mmol/L (ref 135–145)
Total Bilirubin: 0.2 mg/dL — ABNORMAL LOW (ref 0.3–1.2)
Total Protein: 7.1 g/dL (ref 6.5–8.1)

## 2017-03-20 LAB — CBC WITH DIFFERENTIAL/PLATELET
BASOS ABS: 0 10*3/uL (ref 0–0.1)
BASOS PCT: 0 %
Eosinophils Absolute: 0.1 10*3/uL (ref 0–0.7)
Eosinophils Relative: 1 %
HCT: 36.9 % (ref 35.0–47.0)
Hemoglobin: 12.2 g/dL (ref 12.0–16.0)
Lymphocytes Relative: 19 %
Lymphs Abs: 2.3 10*3/uL (ref 1.0–3.6)
MCH: 29.8 pg (ref 26.0–34.0)
MCHC: 33.1 g/dL (ref 32.0–36.0)
MCV: 89.9 fL (ref 80.0–100.0)
MONO ABS: 0.7 10*3/uL (ref 0.2–0.9)
Monocytes Relative: 6 %
NEUTROS ABS: 9 10*3/uL — AB (ref 1.4–6.5)
Neutrophils Relative %: 74 %
PLATELETS: 264 10*3/uL (ref 150–440)
RBC: 4.11 MIL/uL (ref 3.80–5.20)
RDW: 14.7 % — AB (ref 11.5–14.5)
WBC: 12.1 10*3/uL — AB (ref 3.6–11.0)

## 2017-03-20 LAB — HCG, QUANTITATIVE, PREGNANCY: HCG, BETA CHAIN, QUANT, S: 14977 m[IU]/mL — AB (ref <5)

## 2017-03-20 LAB — ABO/RH: ABO/RH(D): O POS

## 2017-03-20 MED ORDER — PRENATAL MULTIVITAMIN CH: 1.0000 | ORAL_TABLET | Freq: Every day | ORAL | 2 refills | Status: DC

## 2017-03-20 NOTE — Discharge Instructions (Signed)
Your ultrasound appears to be normal.  The pregnancy appears to be [redacted] weeks along, which means you are well into the second trimester.  Start taking prenatal vitamins.  Follow up with prenatal care as soon as possible. Avoid alcohol or taking ibuprofen, naproxen, Advil, or Aleve, as these can all harm the pregnancy.

## 2017-03-20 NOTE — ED Notes (Signed)
Patient has been taken to ultrasound.

## 2017-03-20 NOTE — ED Provider Notes (Signed)
[facility] Emergency Department Provider Note  ____________________________________________  Time seen: Approximately 9:38 PM  I have reviewed the triage vital signs and the nursing notes.   HISTORY  Chief Complaint Vaginal Bleeding  Patient is Spanish-speaking. Offered interpreter, patient declines and prefers to use her spouse as her interpreter.  HPI Kaitlin Hanson is a 32 y.o. female who reports being 3 months pregnant and complains of pelvic cramping pain radiating to the back that started yesterday and is associated with intermittent vaginal bleeding for the past 10 days. Denies chest pain shortness of breath fevers chills or sweats. No passage of tissue or clots. No prenatal care. Not taking prenatal vitamins. No aggravating or alleviating factors. Moderate intensity.  Denies urinary symptoms. No abnormal vaginal discharge  Past Medical History:  Diagnosis Date  . Medical history non-contributory      Patient Active Problem List   Diagnosis Date Noted  . Anemia in pregnancy 03/25/2016  . Pregnancy 03/24/2016     History reviewed. No pertinent surgical history.   Prior to Admission medications   Medication Sig Start Date End Date Taking? Authorizing Provider  ferrous sulfate 325 (65 FE) MG EC tablet Take 325 mg by mouth 3 (three) times daily with meals.    [provider]  ibuprofen (ADVIL,MOTRIN) 600 MG tablet Take 1 tablet (600 mg total) by mouth every 6 (six) hours. 03/25/16   Karena Addison, CNM  Prenatal Vit-Fe Fumarate-FA (PRENATAL MULTIVITAMIN) TABS tablet Take 1 tablet by mouth daily at 12 noon. 03/20/17   Sharman Cheek, MD     Allergies Patient has no known allergies.   No family history on file.  Social History Social History   Tobacco Use  . Smoking status: Never Smoker  . Smokeless tobacco: Never Used  Substance Use Topics  . Alcohol use: No  . Drug use: No    Review of  Systems  Constitutional:   No fever or chills.  ENT:   No sore throat. No rhinorrhea. Cardiovascular:   No chest pain or syncope. Respiratory:   No dyspnea or cough. Gastrointestinal:  Positive as above for cramping pelvic pain without vomiting and diarrhea.  Musculoskeletal:   Negative for focal pain or swelling All other systems reviewed and are negative except as documented above in ROS and HPI.  ____________________________________________   PHYSICAL EXAM:  VITAL SIGNS: ED Triage Vitals  Enc Vitals Group     BP 03/20/17 1751 127/67     Pulse Rate 03/20/17 1751 82     Resp 03/20/17 1751 20     Temp 03/20/17 1751 98.1 F (36.7 C)     Temp Source 03/20/17 1751 Oral     SpO2 03/20/17 1751 100 %     Weight 03/20/17 1754 172 lb (78 kg)     Height 03/20/17 2133 5\' 3"  (1.6 m)     Head Circumference --      Peak Flow --      Pain Score 03/20/17 1754 5     Pain Loc --      Pain Edu? --      Excl. in GC? --     Vital signs reviewed, nursing assessments reviewed.   Constitutional:   Alert and oriented. Well appearing and in no distress. Eyes:   No scleral icterus.  EOMI. No nystagmus. No conjunctival pallor. PERRL. ENT   Head:   Normocephalic and atraumatic.   Nose:   No congestion/rhinnorhea.    Mouth/Throat:   MMM,  no pharyngeal erythema. No peritonsillar mass.    Neck:   No meningismus. Full ROM. Hematological/Lymphatic/Immunilogical:   No cervical lymphadenopathy. Cardiovascular:   RRR. Symmetric bilateral radial and DP pulses.  No murmurs.  Respiratory:   Normal respiratory effort without tachypnea/retractions. Breath sounds are clear and equal bilaterally. No wheezes/rales/rhonchi. Gastrointestinal:   Soft and nontender. Gravid abdomen, size suggests ~[redacted] week gestation. There is no CVA tenderness.  No rebound, rigidity, or guarding. Genitourinary:   deferred Musculoskeletal:   Normal range of motion in all extremities. No joint effusions.  No lower  extremity tenderness.  No edema. Neurologic:   Normal speech and language.  Motor grossly intact. No gross focal neurologic deficits are appreciated.  Skin:    Skin is warm, dry and intact. No rash noted.  No petechiae, purpura, or bullae.  ____________________________________________    LABS (pertinent positives/negatives) (all labs ordered are listed, but only abnormal results are displayed) Labs Reviewed  HCG, QUANTITATIVE, PREGNANCY - Abnormal; Notable for the following components:      Result Value   hCG, Beta Chain, Quant, S 14,977 (*)    All other components within normal limits  CBC WITH DIFFERENTIAL/PLATELET - Abnormal; Notable for the following components:   WBC 12.1 (*)    RDW 14.7 (*)    Neutro Abs 9.0 (*)    All other components within normal limits  COMPREHENSIVE METABOLIC PANEL - Abnormal; Notable for the following components:   Calcium 8.8 (*)    Albumin 3.3 (*)    Total Bilirubin 0.2 (*)    All other components within normal limits  ABO/RH   ____________________________________________   EKG    ____________________________________________    RADIOLOGY  [place] Ob Limited  Result Date: 03/20/2017 CLINICAL DATA:  32 year old pregnant female presents with 10 days of pelvic pain and vaginal bleeding. Uncertain LMP. EXAM: LIMITED OBSTETRIC ULTRASOUND FINDINGS: Number of Fetuses: 1 Heart Rate:  144 bpm Movement: Yes Presentation: Transverse, with fetal head on the maternal right side Placental Location: Anterior Previa: No Amniotic Fluid (Subjective): Within normal limits. Deepest single vertical fluid pocket 6.7 cm. BPD:  5.3cm 22w 0d MATERNAL FINDINGS: Cervix: Appears closed. Cervix length approximately 3.1 cm on these transabdominal images. Uterus/Adnexae: No abnormality visualized. IMPRESSION: 1. Single living intrauterine gestation in transverse lie at 22 weeks 0 days by limited fetal biometry. 2. No acute gestational abnormality. Normal fetal cardiac activity.  Subjectively normal amniotic fluid volume. No placental previa or abruption demonstrated. Normal cervix length with no evidence of cervical funneling. This exam is performed on an emergent basis and does not comprehensively evaluate fetal size, dating, or anatomy; follow-up complete OB US should be considered if further fetal assessment is warranted. Electronically Signed   By: Delbert PhenixJason A Poff M.D.   On: 03/20/2017 21:17    ____________________________________________   PROCEDURES Procedures  ____________________________________________    CLINICAL IMPRESSION / ASSESSMENT AND PLAN / ED COURSE  Pertinent labs & imaging results that were available during my care of the patient were reviewed by me and considered in my medical decision making (see chart for details).   Patient presents with vaginal bleeding. By her report she is in her first trimester, reports her LMP was mid September 2018. Exam suggests that she is further along. Ultrasound obtained for evaluation of the pregnancy and to ensure she doesn't have an ectopic. Found to have an IUP, size of 22 weeks. No apparent complications at this time. Vital signs are normal. Labs are reassuring, Rh+. Discharge home to follow  up with obstetrics or health department. Advised to start prenatal vitamins and avoid alcohol and NSAIDs. Doubt STI PID TOA torsion.      ____________________________________________   FINAL CLINICAL IMPRESSION(S) / ED DIAGNOSES    Final diagnoses:  Vaginal bleeding in pregnancy  Second trimester pregnancy       Portions of this note were generated with dragon dictation software. Dictation errors may occur despite best attempts at proofreading.    Sharman Cheek, MD 03/20/17 2142

## 2017-03-20 NOTE — ED Triage Notes (Signed)
Pt with vaginal spotting since Jan. 4 and is three mths pregnant. Pt states abd cramping as well. This is pt fifth pregnancy. Has not been seen by OB yet.

## 2017-04-07 ENCOUNTER — Other Ambulatory Visit: Payer: Self-pay | Admitting: Advanced Practice Midwife

## 2017-04-07 DIAGNOSIS — Z3482 Encounter for supervision of other normal pregnancy, second trimester: Secondary | ICD-10-CM

## 2017-04-08 LAB — OB RESULTS CONSOLE RUBELLA ANTIBODY, IGM: RUBELLA: IMMUNE

## 2017-04-08 LAB — OB RESULTS CONSOLE HEPATITIS B SURFACE ANTIGEN: Hepatitis B Surface Ag: NEGATIVE

## 2017-04-08 LAB — OB RESULTS CONSOLE VARICELLA ZOSTER ANTIBODY, IGG: Varicella: IMMUNE

## 2017-04-08 LAB — OB RESULTS CONSOLE HIV ANTIBODY (ROUTINE TESTING): HIV: NONREACTIVE

## 2017-04-08 LAB — OB RESULTS CONSOLE RPR: RPR: NONREACTIVE

## 2017-04-12 ENCOUNTER — Ambulatory Visit
Admission: RE | Admit: 2017-04-12 | Discharge: 2017-04-12 | Disposition: A | Discharge status: Home / Self Care | Payer: Self-pay | Source: Ambulatory Visit | Attending: Advanced Practice Midwife | Admitting: Advanced Practice Midwife

## 2017-04-12 DIAGNOSIS — Z3483 Encounter for supervision of other normal pregnancy, third trimester: Secondary | ICD-10-CM | POA: Insufficient documentation

## 2017-04-12 DIAGNOSIS — Z3482 Encounter for supervision of other normal pregnancy, second trimester: Secondary | ICD-10-CM

## 2017-04-12 DIAGNOSIS — Z3A25 25 weeks gestation of pregnancy: Secondary | ICD-10-CM | POA: Insufficient documentation

## 2017-06-29 LAB — OB RESULTS CONSOLE GC/CHLAMYDIA
CHLAMYDIA, DNA PROBE: NEGATIVE
GC PROBE AMP, GENITAL: NEGATIVE

## 2017-06-29 LAB — OB RESULTS CONSOLE GBS: STREP GROUP B AG: NEGATIVE

## 2017-07-20 ENCOUNTER — Inpatient Hospital Stay
Admission: EM | Admit: 2017-07-20 | Discharge: 2017-07-22 | DRG: 768 | Disposition: A | Discharge status: Home / Self Care | Payer: Medicaid Other | Attending: Obstetrics and Gynecology | Admitting: Obstetrics and Gynecology

## 2017-07-20 ENCOUNTER — Other Ambulatory Visit: Payer: Self-pay

## 2017-07-20 DIAGNOSIS — D509 Iron deficiency anemia, unspecified: Secondary | ICD-10-CM | POA: Diagnosis present

## 2017-07-20 DIAGNOSIS — O99214 Obesity complicating childbirth: Secondary | ICD-10-CM | POA: Diagnosis present

## 2017-07-20 DIAGNOSIS — E669 Obesity, unspecified: Secondary | ICD-10-CM | POA: Diagnosis present

## 2017-07-20 DIAGNOSIS — Z3A39 39 weeks gestation of pregnancy: Secondary | ICD-10-CM | POA: Diagnosis not present

## 2017-07-20 DIAGNOSIS — Z3483 Encounter for supervision of other normal pregnancy, third trimester: Secondary | ICD-10-CM | POA: Diagnosis present

## 2017-07-20 DIAGNOSIS — O9902 Anemia complicating childbirth: Secondary | ICD-10-CM | POA: Diagnosis present

## 2017-07-20 LAB — TYPE AND SCREEN
ABO/RH(D): O POS
ANTIBODY SCREEN: NEGATIVE

## 2017-07-20 LAB — CBC
HEMATOCRIT: 35.8 % (ref 35.0–47.0)
Hemoglobin: 11.8 g/dL — ABNORMAL LOW (ref 12.0–16.0)
MCH: 28.1 pg (ref 26.0–34.0)
MCHC: 33.1 g/dL (ref 32.0–36.0)
MCV: 84.9 fL (ref 80.0–100.0)
Platelets: 300 10*3/uL (ref 150–440)
RBC: 4.22 MIL/uL (ref 3.80–5.20)
RDW: 16.6 % — ABNORMAL HIGH (ref 11.5–14.5)
WBC: 11.9 10*3/uL — ABNORMAL HIGH (ref 3.6–11.0)

## 2017-07-20 MED ORDER — OXYCODONE HCL 5 MG PO TABS: 5.0000 mg | ORAL_TABLET | ORAL | Status: DC | PRN

## 2017-07-20 MED ORDER — IBUPROFEN 600 MG PO TABS
600.0000 mg | ORAL_TABLET | Freq: Four times a day (QID) | ORAL | Status: DC
  Administered 2017-07-20 – 2017-07-21 (×3): 600 mg via ORAL
  Filled 2017-07-20 (×4): qty 1

## 2017-07-20 MED ORDER — PRENATAL MULTIVITAMIN CH
1.0000 | ORAL_TABLET | Freq: Every day | ORAL | Status: DC
  Administered 2017-07-21: 1 via ORAL
  Filled 2017-07-20: qty 1

## 2017-07-20 MED ORDER — MISOPROSTOL 200 MCG PO TABS
ORAL_TABLET | ORAL | Status: AC
  Administered 2017-07-20: 800 ug via RECTAL
  Filled 2017-07-20: qty 4

## 2017-07-20 MED ORDER — SODIUM CHLORIDE 0.9% FLUSH: 3.0000 mL | INTRAVENOUS | Status: DC | PRN

## 2017-07-20 MED ORDER — OXYTOCIN BOLUS FROM INFUSION
500.0000 mL | Freq: Once | INTRAVENOUS | Status: AC
  Administered 2017-07-20: 500 mL via INTRAVENOUS

## 2017-07-20 MED ORDER — ONDANSETRON HCL 4 MG PO TABS: 4.0000 mg | ORAL_TABLET | ORAL | Status: DC | PRN

## 2017-07-20 MED ORDER — FLEET ENEMA 7-19 GM/118ML RE ENEM: 1.0000 | ENEMA | Freq: Every day | RECTAL | Status: DC | PRN

## 2017-07-20 MED ORDER — BUTORPHANOL TARTRATE 2 MG/ML IJ SOLN
2.0000 mg | Freq: Once | INTRAMUSCULAR | Status: AC
  Administered 2017-07-20: 2 mg via INTRAVENOUS

## 2017-07-20 MED ORDER — BENZOCAINE-MENTHOL 20-0.5 % EX AERO
1.0000 "application " | INHALATION_SPRAY | CUTANEOUS | Status: DC | PRN
  Administered 2017-07-20: 1 via TOPICAL
  Filled 2017-07-20: qty 56

## 2017-07-20 MED ORDER — LIDOCAINE HCL (PF) 1 % IJ SOLN: 30.0000 mL | INTRAMUSCULAR | Status: DC | PRN

## 2017-07-20 MED ORDER — OXYTOCIN 40 UNITS IN LACTATED RINGERS INFUSION - SIMPLE MED
2.5000 [IU]/h | INTRAVENOUS | Status: DC
  Administered 2017-07-20: 2.5 [IU]/h via INTRAVENOUS

## 2017-07-20 MED ORDER — MEASLES, MUMPS & RUBELLA VAC ~~LOC~~ INJ
0.5000 mL | INJECTION | Freq: Once | SUBCUTANEOUS | Status: DC
  Filled 2017-07-20: qty 0.5

## 2017-07-20 MED ORDER — LIDOCAINE HCL (PF) 1 % IJ SOLN
INTRAMUSCULAR | Status: AC
  Filled 2017-07-20: qty 30

## 2017-07-20 MED ORDER — ACETAMINOPHEN 325 MG PO TABS: 650.0000 mg | ORAL_TABLET | ORAL | Status: DC | PRN

## 2017-07-20 MED ORDER — SODIUM CHLORIDE 0.9 % IV SOLN: 250.0000 mL | INTRAVENOUS | Status: DC | PRN

## 2017-07-20 MED ORDER — SOD CITRATE-CITRIC ACID 500-334 MG/5ML PO SOLN: 30.0000 mL | ORAL | Status: DC | PRN

## 2017-07-20 MED ORDER — COCONUT OIL OIL
1.0000 "application " | TOPICAL_OIL | Status: DC | PRN
  Administered 2017-07-21: 1 via TOPICAL
  Filled 2017-07-20: qty 120

## 2017-07-20 MED ORDER — DIBUCAINE 1 % RE OINT: 1.0000 "application " | TOPICAL_OINTMENT | RECTAL | Status: DC | PRN

## 2017-07-20 MED ORDER — OXYTOCIN 10 UNIT/ML IJ SOLN
INTRAMUSCULAR | Status: AC
  Filled 2017-07-20: qty 2

## 2017-07-20 MED ORDER — LACTATED RINGERS IV SOLN: 500.0000 mL | INTRAVENOUS | Status: DC | PRN

## 2017-07-20 MED ORDER — DIPHENHYDRAMINE HCL 25 MG PO CAPS: 25.0000 mg | ORAL_CAPSULE | Freq: Four times a day (QID) | ORAL | Status: DC | PRN

## 2017-07-20 MED ORDER — AMMONIA AROMATIC IN INHA
RESPIRATORY_TRACT | Status: AC
  Filled 2017-07-20: qty 10

## 2017-07-20 MED ORDER — ONDANSETRON HCL 4 MG/2ML IJ SOLN: 4.0000 mg | INTRAMUSCULAR | Status: DC | PRN

## 2017-07-20 MED ORDER — SODIUM CHLORIDE 0.9% FLUSH
3.0000 mL | Freq: Two times a day (BID) | INTRAVENOUS | Status: DC
  Administered 2017-07-21: 3 mL via INTRAVENOUS

## 2017-07-20 MED ORDER — ZOLPIDEM TARTRATE 5 MG PO TABS: 5.0000 mg | ORAL_TABLET | Freq: Every evening | ORAL | Status: DC | PRN

## 2017-07-20 MED ORDER — SENNOSIDES-DOCUSATE SODIUM 8.6-50 MG PO TABS
2.0000 | ORAL_TABLET | ORAL | Status: DC
  Administered 2017-07-21 – 2017-07-22 (×2): 2 via ORAL
  Filled 2017-07-20 (×2): qty 2

## 2017-07-20 MED ORDER — ONDANSETRON HCL 4 MG/2ML IJ SOLN: 4.0000 mg | Freq: Four times a day (QID) | INTRAMUSCULAR | Status: DC | PRN

## 2017-07-20 MED ORDER — BUTORPHANOL TARTRATE 2 MG/ML IJ SOLN
1.0000 mg | INTRAMUSCULAR | Status: DC | PRN
  Filled 2017-07-20: qty 1

## 2017-07-20 MED ORDER — TERBUTALINE SULFATE 1 MG/ML IJ SOLN: 0.2500 mg | Freq: Once | INTRAMUSCULAR | Status: DC | PRN

## 2017-07-20 MED ORDER — LACTATED RINGERS IV SOLN
INTRAVENOUS | Status: DC
  Administered 2017-07-20: 08:00:00 via INTRAVENOUS

## 2017-07-20 MED ORDER — MISOPROSTOL 25 MCG QUARTER TABLET: 25.0000 ug | ORAL_TABLET | ORAL | Status: DC | PRN

## 2017-07-20 MED ORDER — SILVER NITRATE-POT NITRATE 75-25 % EX MISC
5.0000 | CUTANEOUS | Status: AC
  Administered 2017-07-20: 5 via TOPICAL
  Filled 2017-07-20: qty 5

## 2017-07-20 MED ORDER — BISACODYL 10 MG RE SUPP: 10.0000 mg | Freq: Every day | RECTAL | Status: DC | PRN

## 2017-07-20 MED ORDER — OXYTOCIN 40 UNITS IN LACTATED RINGERS INFUSION - SIMPLE MED
INTRAVENOUS | Status: AC
  Administered 2017-07-20: 500 mL via INTRAVENOUS
  Filled 2017-07-20: qty 1000

## 2017-07-20 MED ORDER — TETANUS-DIPHTH-ACELL PERTUSSIS 5-2.5-18.5 LF-MCG/0.5 IM SUSP: 0.5000 mL | Freq: Once | INTRAMUSCULAR | Status: DC

## 2017-07-20 MED ORDER — MISOPROSTOL 200 MCG PO TABS
800.0000 ug | ORAL_TABLET | ORAL | Status: AC
  Administered 2017-07-20: 800 ug via RECTAL

## 2017-07-20 MED ORDER — SIMETHICONE 80 MG PO CHEW: 80.0000 mg | CHEWABLE_TABLET | ORAL | Status: DC | PRN

## 2017-07-20 MED ORDER — WITCH HAZEL-GLYCERIN EX PADS: 1.0000 "application " | MEDICATED_PAD | CUTANEOUS | Status: DC | PRN

## 2017-07-20 NOTE — OB Triage Note (Signed)
Patient presented to L&D with complaints of contractions that started at 3:45 this morning, states she had some bloody discharge and not sure if her water has broken.  Denies decreased fetal movement.

## 2017-07-20 NOTE — H&P (Signed)
OB History & Physical   History of Present Illness:  Chief Complaint:   HPI:  Marieli Rudy is a 32 y.o. [phone] female with LMP of 11/18/16 and EDD of 08/25/17 but [place] at 22 weeks with EDD of 07/24/17 at [redacted]w[redacted]d. She presents to L&D for active labor and BBOW.   +FM, no CTX, no LOF, no VB  Pregnancy Issues: 1. Close interconceptual spacing 2. Daughter had meduloblastoma age 62 3. Late entry to care 4. Rh pos 5. 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 Medical history non-contributory     No past surgical history on file.  No Known Allergies  Prior to Admission medications   Medication Sig Start Date End Date Taking? Authorizing Provider  ferrous sulfate 325 (65 FE) MG EC tablet Take 325 mg by mouth 3 (three) times daily with meals.    [provider]  ibuprofen (ADVIL,MOTRIN) 600 MG tablet Take 1 tablet (600 mg total) by mouth every 6 (six) hours. 03/25/16   Karena Addison, CNM  Prenatal Vit-Fe Fumarate-FA (PRENATAL MULTIVITAMIN) TABS tablet Take 1 tablet by mouth daily at 12 noon. 03/20/17   Sharman Cheek, MD     Prenatal care site: [facility]   Social History: She  reports that she has never smoked. She has never used smokeless tobacco. She reports that she does not drink alcohol or use drugs.  Family History: family history is not on file.   Review of Systems: A full review of systems was performed and negative except as noted in the HPI.     Physical Exam:  Vital Signs: LMP 11/19/2016 (Approximate)  General: no acute distress.  HEENT: normocephalic, atraumatic Heart: regular rate & rhythm.  No murmurs/rubs/gallops Lungs: clear to auscultation bilaterally, normal respiratory effort Abdomen: soft, gravid, non-tender;  EFW: &#9oz Pelvic:   External: Normal external female genitalia  Cervix: Dilation: 5.5 / Effacement (%): 80 / Station: -1, 0    Extremities:  non-tender, symmetric, 1+edema bilaterally.  DTRs: 2+/0  Neurologic: Alert & oriented x 3.    No results found for this or any previous visit (from the past 24 hour(s)).  Pertinent Results:  Prenatal Labs: Blood type/Rh O pos  Antibody screen neg  Rubella Immune  Varicella Immune  RPR NR  HBsAg Neg  HIV NR  GC neg  Chlamydia neg  Genetic screening negative  1 hour GTT   3 hour GTT   GBS Neg   FHT: 150 q 2.5 to 3TOCO: SVE:  Dilation: 5.5 / Effacement (%): 80 / Station: -1, 0    Cephalic by leopolds  No results found.  Assessment:  Alexiss Iturralde is a 32 y.o. [phone] female at [redacted]w[redacted]d with active labor   Plan:  1. Admit to Labor & Delivery 2. CBC, T&S, Clrs, IVF 3. GBS  negConsents obtained. 4. Continuous efm/toco 5. Pt plans IV pain meds  ----- Myrtie Cruise, MSN, CNM, FNP Certified Nurse Midwife [facility]

## 2017-07-20 NOTE — Plan of Care (Signed)
Care plan discussed with patient and significant other via interpreter.  Patient denies any further questions at this time.

## 2017-07-20 NOTE — Discharge Summary (Signed)
Obstetrical Discharge Summary  Patient Name: Kaitlin Hanson DOB: [birth year] MRN: [id number]  Date of Admission: 07/20/2017 Date of Delivery: 07/20/17 Delivered by: Kaitlin Hanson, CNM Date of Discharge: 07/20/2017  Primary OB:  [facility] [id number] last menstrual period was 11/19/2016 (approximate). EDC Estimated Date of Delivery: 07/24/17 Gestational Age at Delivery: [redacted]w[redacted]d   Antepartum complications: Obesity, Late entry to care, Rh pos, Fe def anemia, Language barrier (Spanish), Daughter had a meduloblastoma Admitting Diagnosis: IUP at term in active labor Secondary Diagnosis: Patient Active Problem List   Diagnosis Date Noted  . Indication for care in labor or delivery 07/20/2017  . Indication for care or intervention in labor or delivery 07/20/2017  . Anemia in pregnancy 03/25/2016  . Pregnancy 03/24/2016    Augmentation: AROM of clear fluid Complications: Heavy bleeding requiring Cytotec 800 mg x 1 and then cervical lac noted and repair with Nitrous oxide and silver nitrate Intrapartum complications/course: Rapid labor process Date of Delivery: 07/20/17 Delivered By: Kaitlin Hanson, CNM Delivery Type: NSVD Anesthesia: Nitrous for repair and 2 mg of Stadol for repair Placenta: sponatneous Laceration: Cx laceration repaired Episiotomy: none Newborn Data: Live born female  Birth Weight: 8 lb 2.2 oz (3690 g) APGAR: 8, 9  Newborn Delivery   Birth date/time:  07/20/2017 08:16:00 Delivery type:  Vaginal, Spontaneous     Postpartum Procedures: None  Post partum course:  Patient had an uncomplicated postpartum course.  By time of discharge on PPD#2, her pain was controlled on oral pain medications; she had appropriate lochia and was ambulating, voiding without difficulty and tolerating regular diet.  She was deemed stable for discharge to home.    Discharge Physical Exam:  BP 118/67 (BP Location: Right Arm)   Pulse 75   Temp 98.3 F (36.8 C) (Oral)   Resp 18   Ht   (1.626 m)   Wt 184 lb (83.5 kg)   LMP 11/19/2016 (Approximate)   SpO2 99%   Breastfeeding? Unknown   BMI 31.58 kg/m   General: NAD, A,A& O x 3 CV: RRR, S1S2, no M/R/G Pulm: CTABL, nl effort ABD: s/nd/nt, fundus firm and below the umbilicus Lochia: moderate, no clots Incision: c/d/i, no further bleeding from cx after repair DVT Evaluation: LE non-ttp, no evidence of DVT on exam.  Hemoglobin  Date Value Ref Range Status  07/20/2017 11.8 (L) 12.0 - 16.0 g/dL Final   HGB  Date Value Ref Range Status  03/11/2014 10.2 (L) 12.0 - 16.0 g/dL Final   HCT  Date Value Ref Range Status  07/20/2017 35.8 35.0 - 47.0 % Final  03/10/2014 35.4 35.0 - 47.0 % Final     Disposition: stable, discharge to home. Baby Feeding: breastmilk Baby Disposition: home with mom  Rh Immune globulin given: N/A  Rubella vaccine given: Immune Tdap vaccine given in AP or PP setting:  Flu vaccine given in AP or PP setting:   Contraception: plans IUD at 6 weeks pp  Prenatal Labs:  See labs below on [facility] records Media Information                Document Information   OB PRENATAL    04/17/2017 12:14  Attached To:  Kaitlin Hanson   Source Information   Default, Provider, MD   Plan:  Kaitlin Hanson was discharged to home in good condition. Follow-up appointment with delivering provider in 6 weeks. Pt wants to go back to [facility] for postpartum care since she h Has no insurance. No sex x 6 weeks.  No driving x 2 weeks.   Discharge Medications: Ibuprofen, PNV,   Signed:  Myrtie Cruise, MSN, CNM, FNP Certified Nurse Midwife [facility]

## 2017-07-21 LAB — CBC
HCT: 30.1 % — ABNORMAL LOW (ref 35.0–47.0)
HEMOGLOBIN: 10.1 g/dL — AB (ref 12.0–16.0)
MCH: 28.3 pg (ref 26.0–34.0)
MCHC: 33.5 g/dL (ref 32.0–36.0)
MCV: 84.6 fL (ref 80.0–100.0)
PLATELETS: 252 10*3/uL (ref 150–440)
RBC: 3.56 MIL/uL — AB (ref 3.80–5.20)
RDW: 16.9 % — ABNORMAL HIGH (ref 11.5–14.5)
WBC: 11.3 10*3/uL — AB (ref 3.6–11.0)

## 2017-07-21 LAB — RPR: RPR: NONREACTIVE

## 2017-07-21 MED ORDER — IBUPROFEN 600 MG PO TABS
600.0000 mg | ORAL_TABLET | Freq: Four times a day (QID) | ORAL | Status: DC
  Administered 2017-07-21 – 2017-07-22 (×4): 600 mg via ORAL
  Filled 2017-07-21 (×4): qty 1

## 2017-07-21 NOTE — Progress Notes (Signed)
Post Partum Day1: ipad for Spanish interpreter used for rounds Subjective: "Feeling well"  Objective: Blood pressure (!) 100/57, pulse 61, temperature 98.1 F (36.7 C), temperature source Oral, resp. rate 17, height  (1.626 m), weight 184 lb (83.5 kg), last menstrual period 11/19/2016, SpO2 98 %, unknown if currently breastfeeding.  Physical Exam:  General:A,A&O x 3 HEENT:Normocephalic, Eyes non-icteric, PERL HEART:S1S2, RRR, no M/R/G Lungs: CTA bilat, no W/R/R Lochia:mod,no clots Uterine Fundus:FF, U-1 Voiding WNL, taking po well  DVT Evaluation: neg Homans  Recent Labs    07/20/17 0749 07/21/17 0453  HGB 11.8* 10.1*  HCT 35.8 30.1*  WBC 11.9* 11.3*  PLT 300 252    Assessment/Plan: A:1.PPD#1 S/P cervical lac with repair 2. Breast/Bottle P: DC home in am   LOS: 1 day   Sharee Pimple 07/21/2017, 8:20 AM  Myrtie Cruise, MSN, CNM, FNP Certified Nurse Midwife [facility]

## 2017-07-22 NOTE — Plan of Care (Signed)
Patient's vital signs stable; fundus firm; small amount lochia; breastfeeding and bottle feeding infant with good technique observed; voiding; good appetite; good po fluids; good maternal-infant and good family bonding observed; plans for discharge today.

## 2017-07-22 NOTE — Progress Notes (Signed)
Reviewed D/C instructions with pt and family. Pt verbalized understanding of teaching. Discharged to home via W/C. Pt to schedule f/u appt.  

## 2017-08-01 ENCOUNTER — Other Ambulatory Visit: Payer: Self-pay | Admitting: Obstetrics and Gynecology

## 2019-10-08 IMAGING — US US OB LIMITED
1 series · 14 of 28 positions shown · non-contrast
Comparison: none

CLINICAL DATA: 31-year-old pregnant female presents with 10 days of
pelvic pain and vaginal bleeding. Uncertain LMP.

EXAM:
LIMITED OBSTETRIC ULTRASOUND

[Series 1: us ob limited · 14 of 28 slices shown]
[im 2/28]
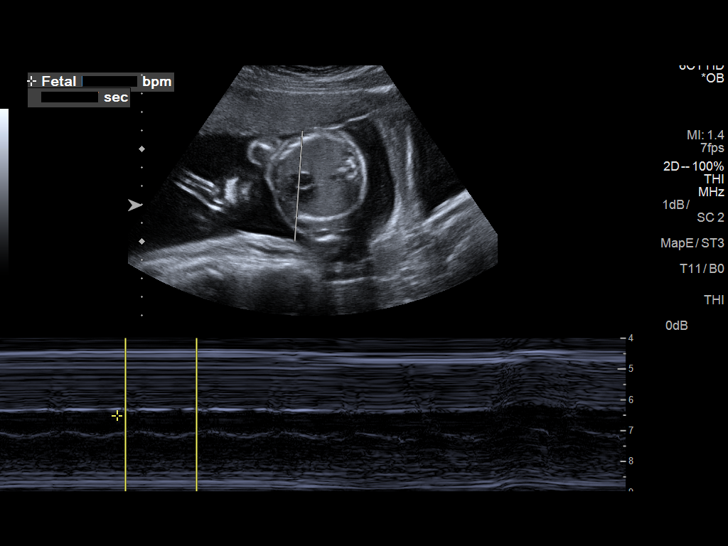
[im 4/28]
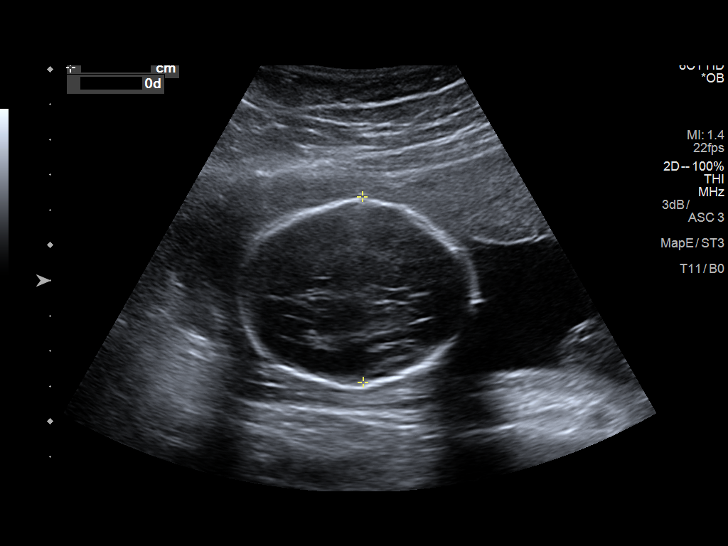
[im 6/28]
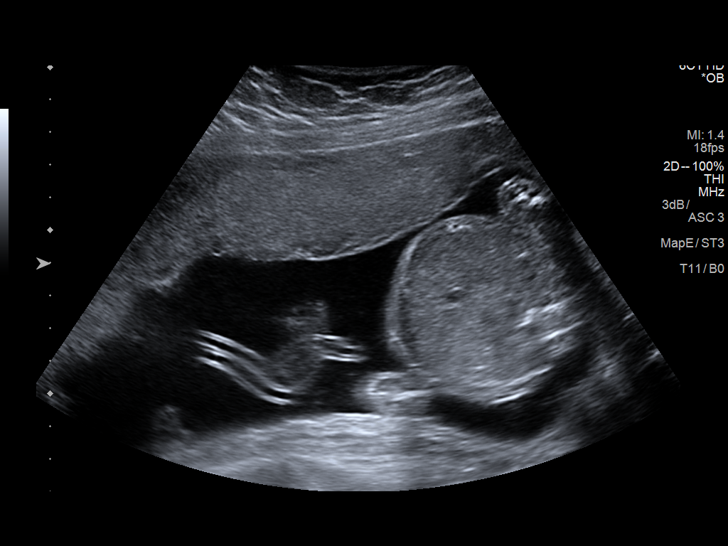
[im 8/28]
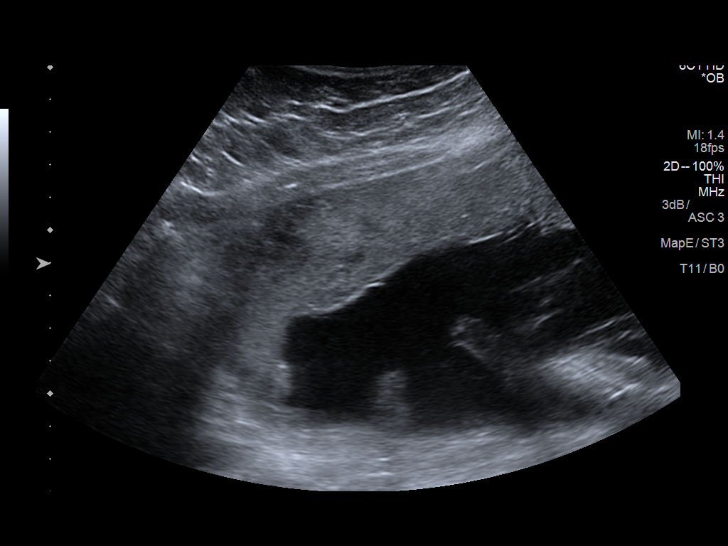
[im 10/28]
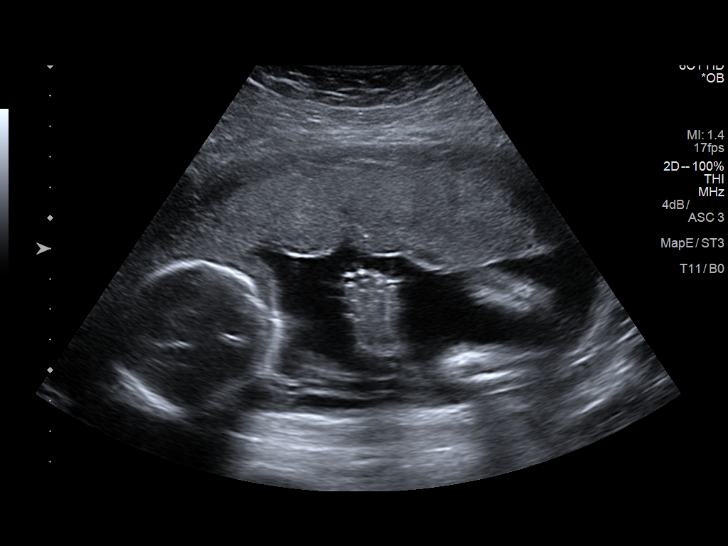
[im 12/28]
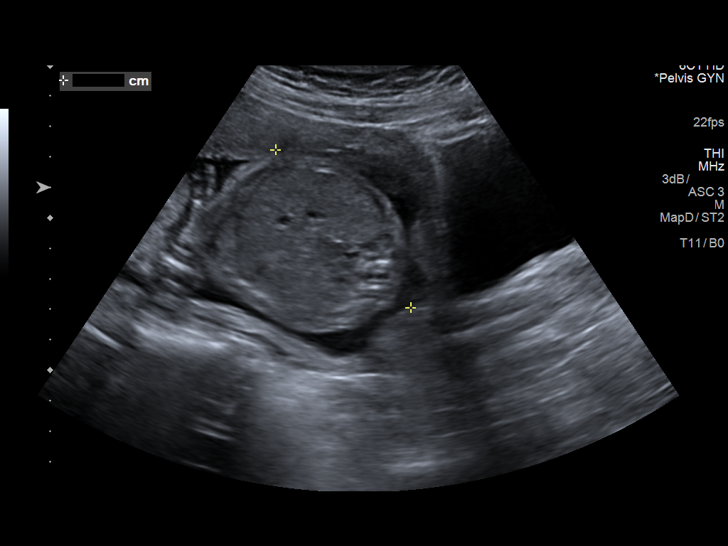
[im 14/28]
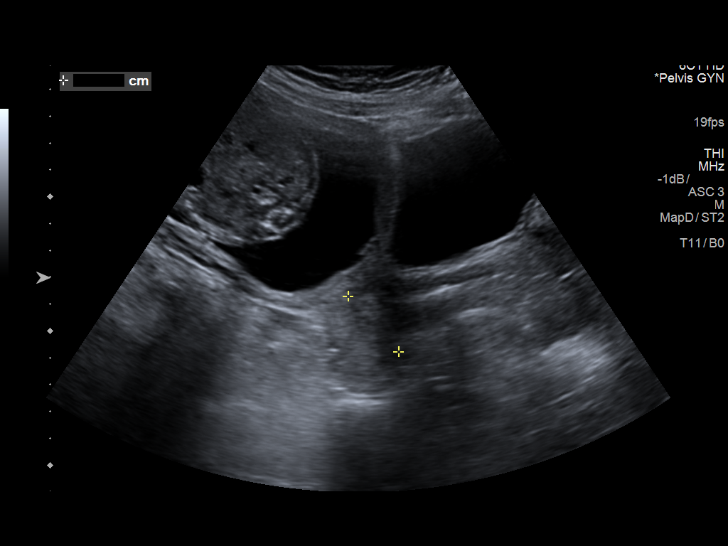
[im 16/28]
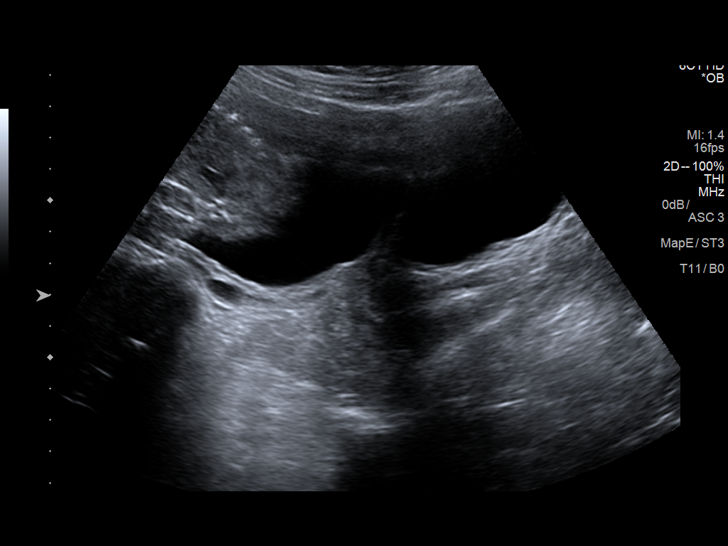
[im 18/28]
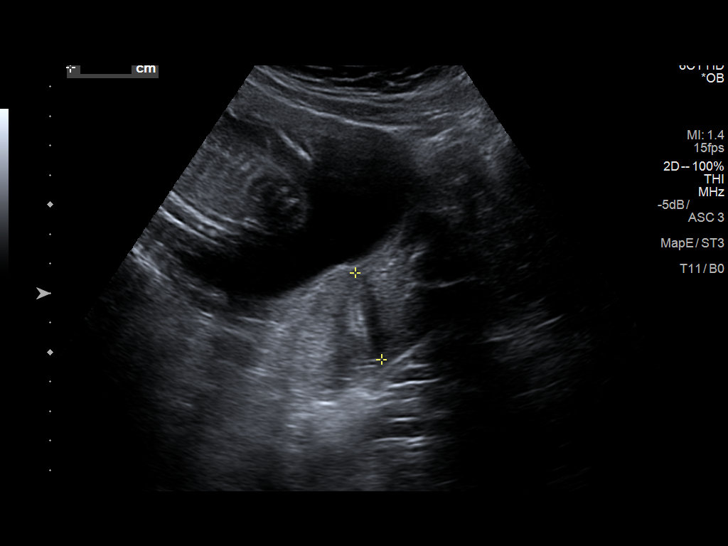
[im 20/28]
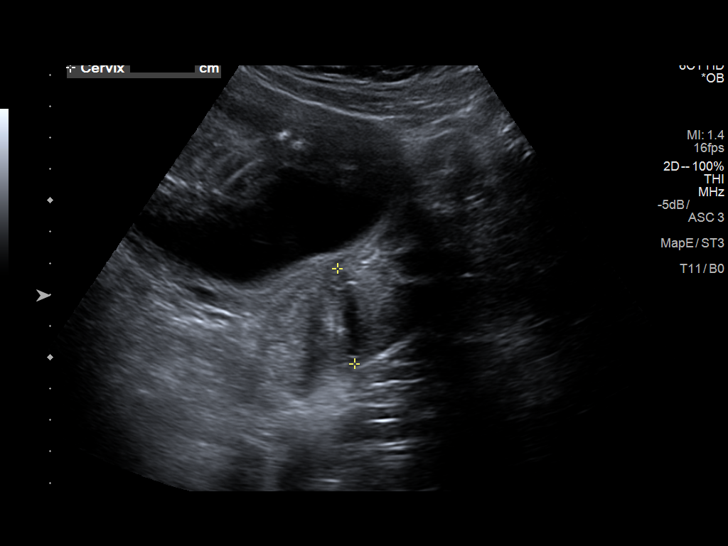
[im 22/28]
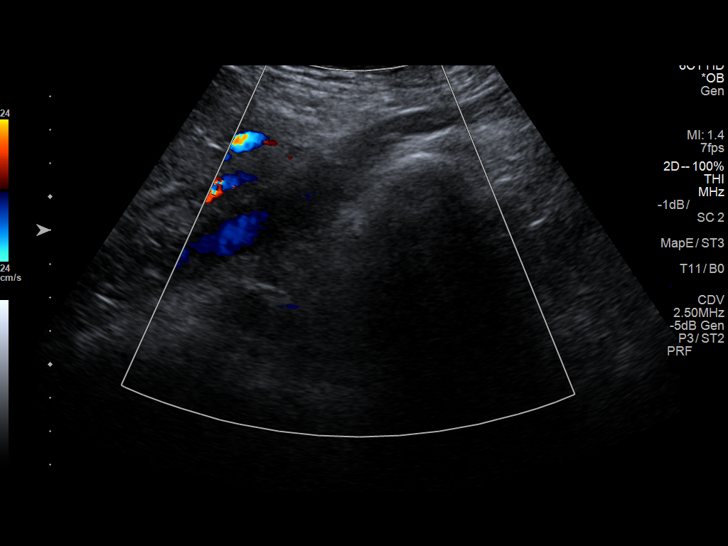
[im 24/28]
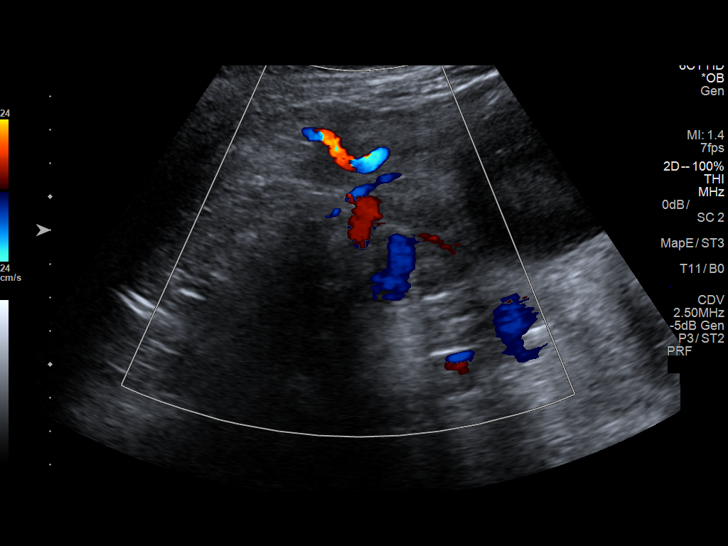
[im 26/28]
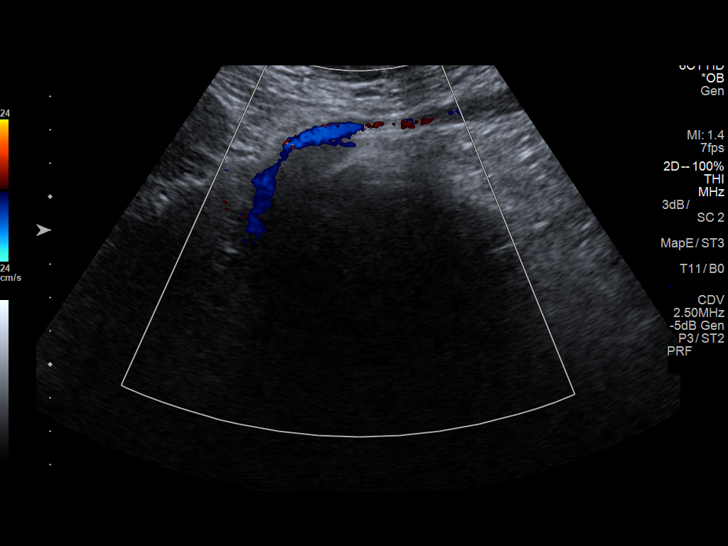
[im 28/28]
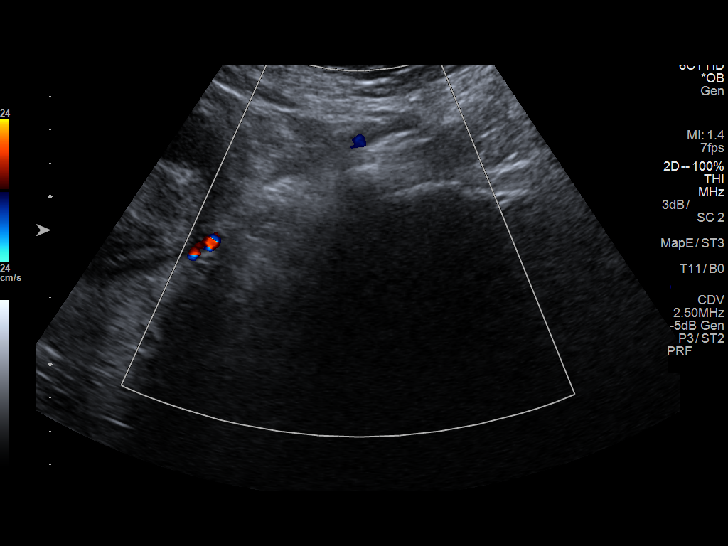

[14 of 28 positions shown; findings below may reference images not displayed]

FINDINGS: Number of Fetuses: 1

Heart Rate:  144 bpm

Movement: Yes

Presentation: Transverse, with fetal head on the maternal right side

Placental Location: Anterior

Previa: No

Amniotic Fluid (Subjective): Within normal limits. Deepest single
vertical fluid pocket 6.7 cm.

BPD:  5.3cm 22w 0d

MATERNAL FINDINGS:

Cervix: Appears closed. Cervix length approximately 3.1 cm on these
transabdominal images.

Uterus/Adnexae: No abnormality visualized.
IMPRESSION: 1. Single living intrauterine gestation in transverse lie at 22
weeks 0 days by limited fetal biometry.
2. No acute gestational abnormality. Normal fetal cardiac activity.
Subjectively normal amniotic fluid volume. No placental previa or
abruption demonstrated. Normal cervix length with no evidence of
cervical funneling.

This exam is performed on an emergent basis and does not
comprehensively evaluate fetal size, dating, or anatomy; follow-up
complete OB US should be considered if further fetal assessment is
warranted.

## 2019-10-31 IMAGING — US US OB COMP +14 WK
1 series · 14 of 28 positions shown · non-contrast
Comparison: none

CLINICAL DATA: Evaluation of fetal anatomy.

EXAM:
OBSTETRICAL ULTRASOUND >14 WKS

[Series 1: us ob comp +14 wk · 0.30mm/px · 14 of 106 slices shown]
[im 4/106]
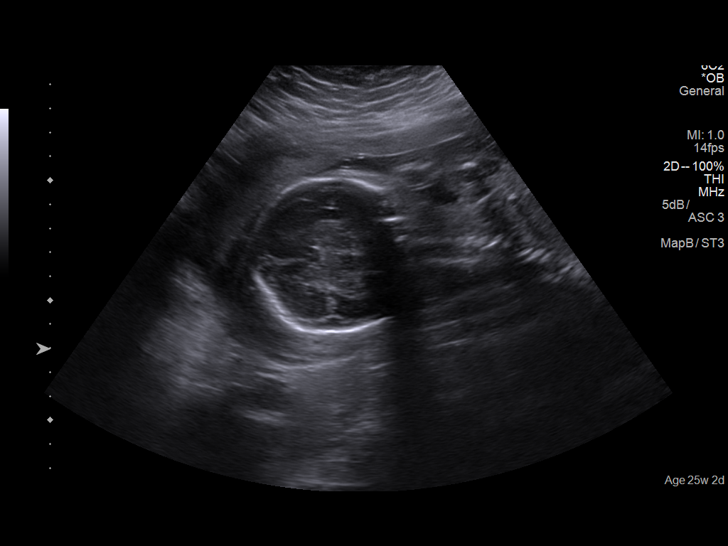
[im 12/106]
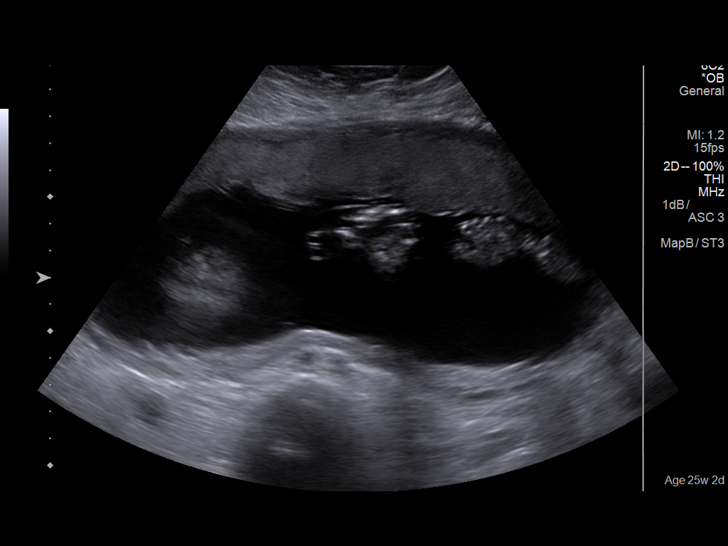
[im 20/106]
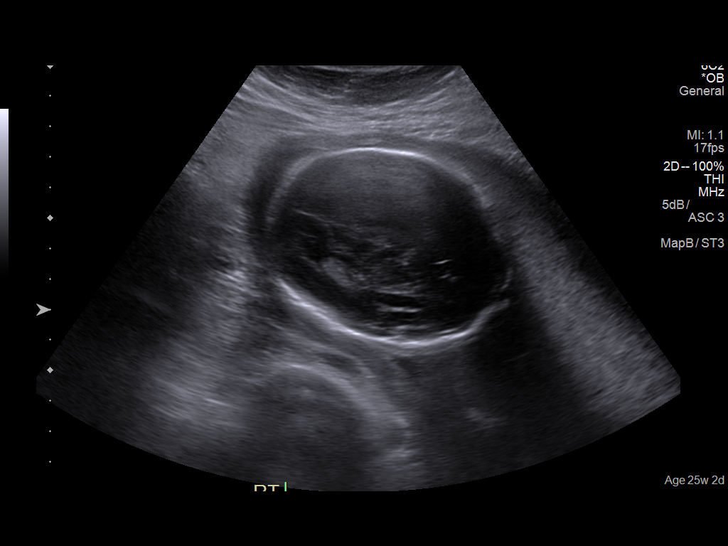
[im 28/106]
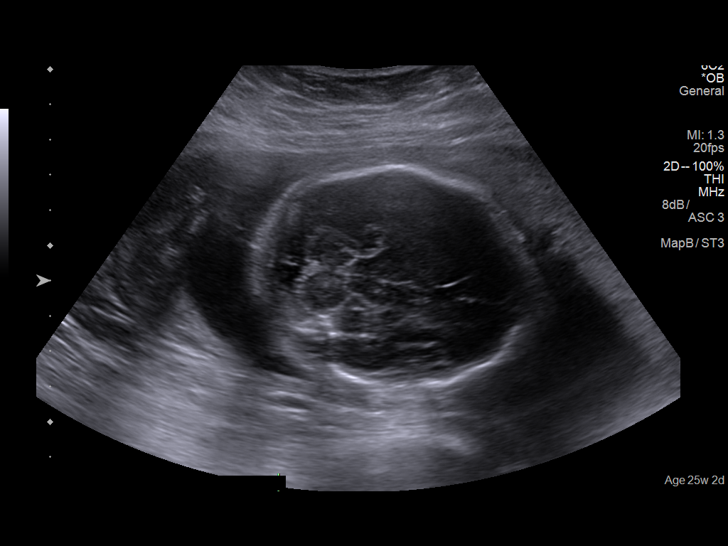
[im 36/106]
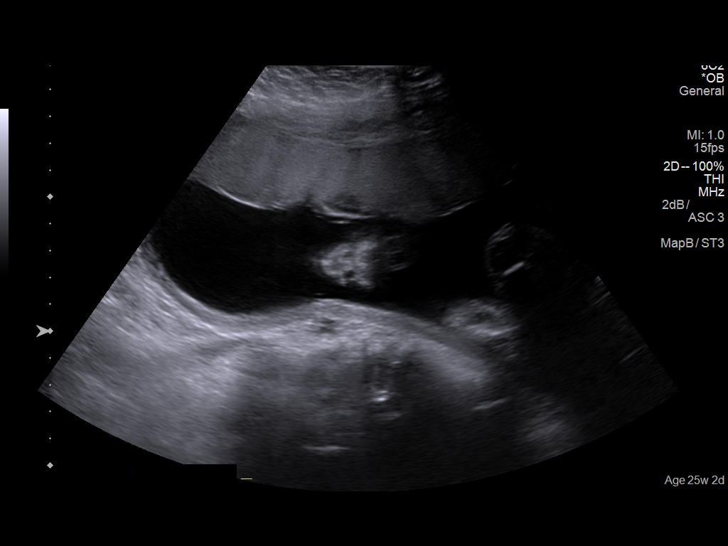
[im 43/106]
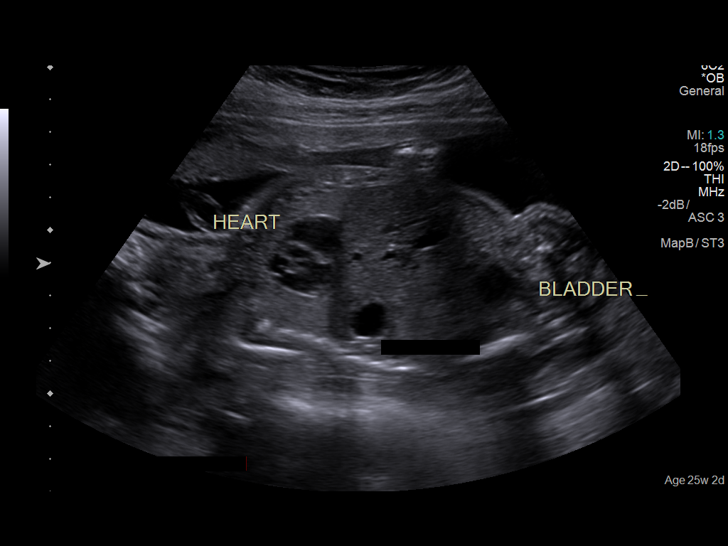
[im 51/106]
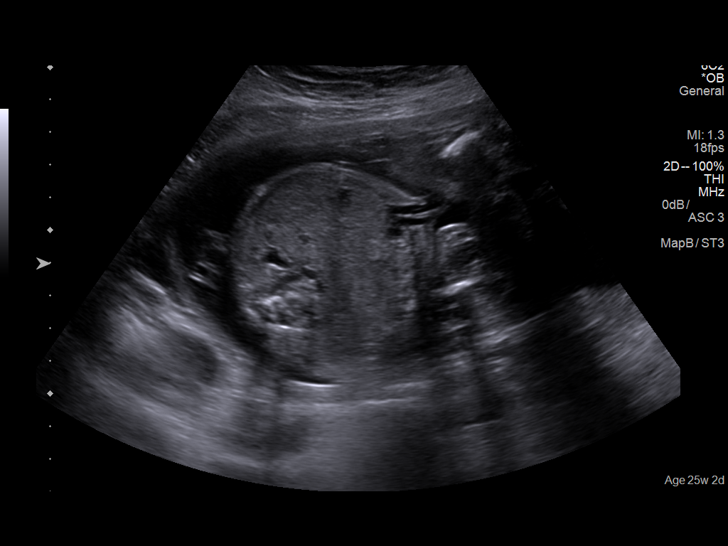
[im 59/106]
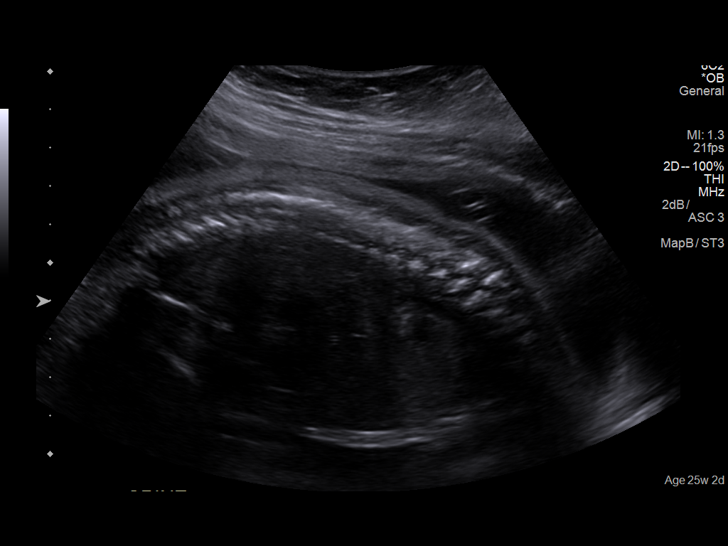
[im 67/106]
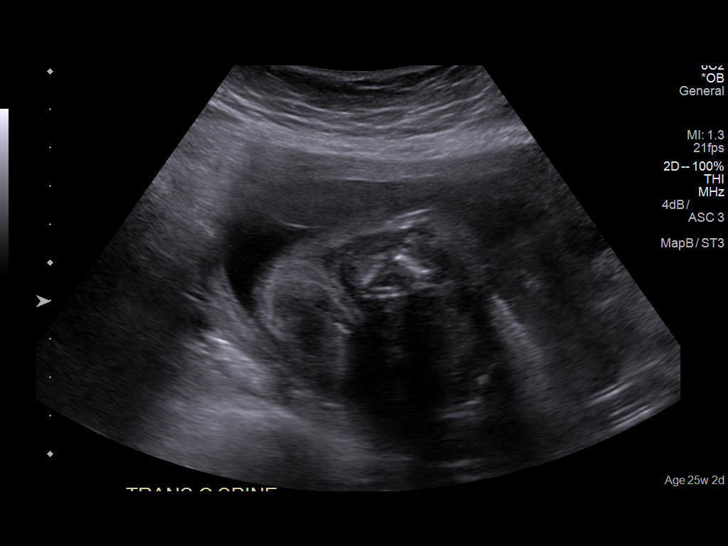
[im 74/106]
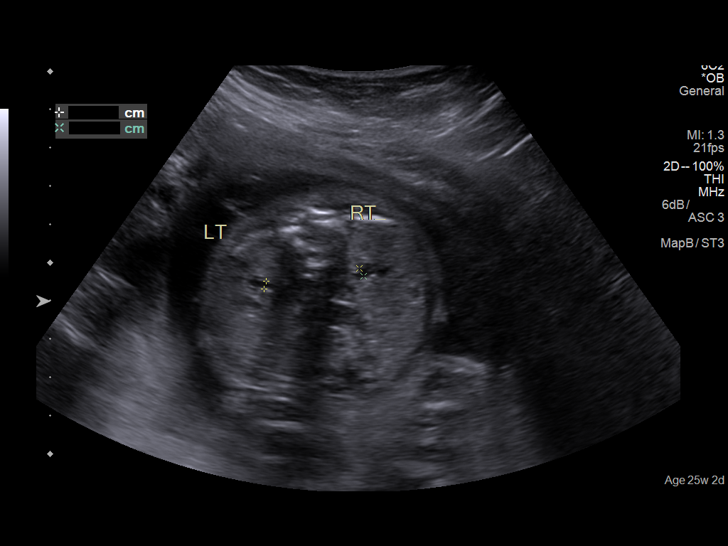
[im 82/106]
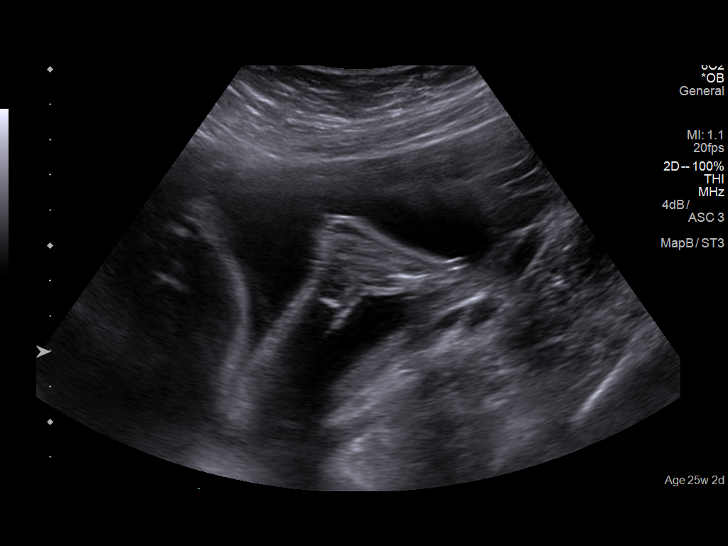
[im 90/106]
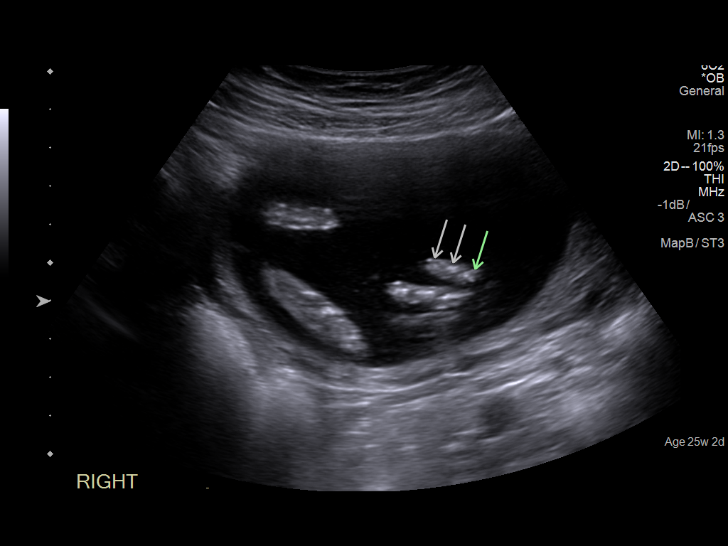
[im 98/106]
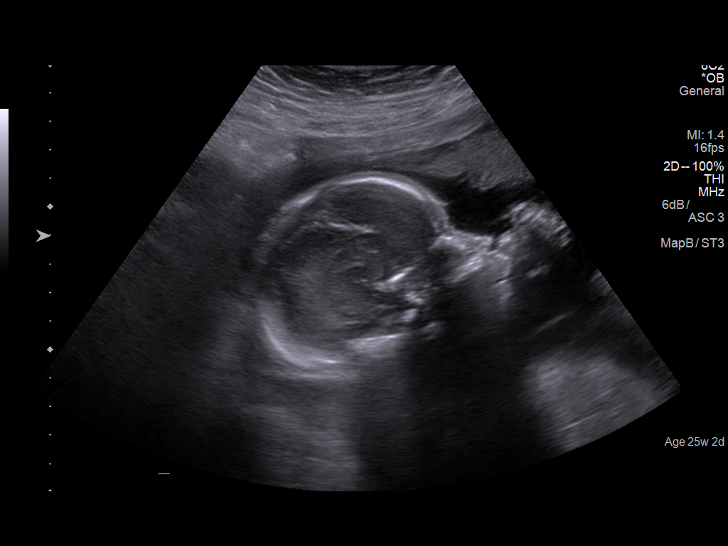
[im 106/106]
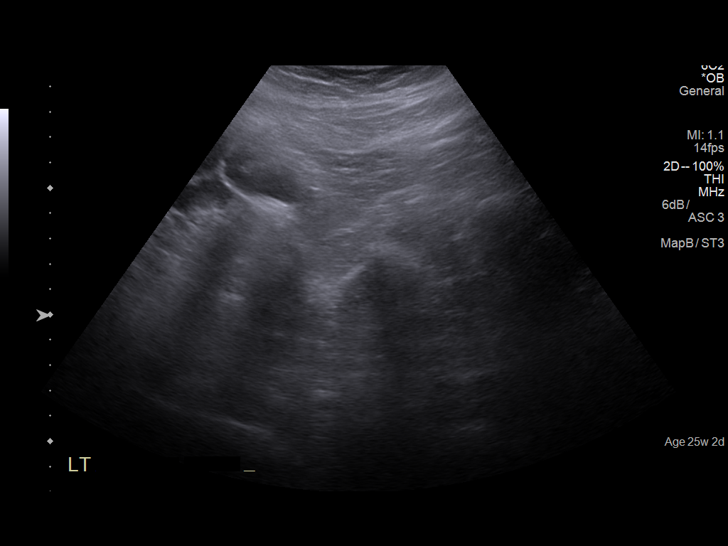

[14 of 28 positions shown; findings below may reference images not displayed]

FINDINGS: Number of Fetuses: 1

Heart Rate:  147 bpm

Movement: Yes

Presentation: Variable

Previa: No

Placental Location: Fundal

Amniotic Fluid (Subjective): Normal

Amniotic Fluid (Objective):

Vertical pocket 8.1cm

FETAL BIOMETRY

BPD: 6.3cm 25w 3d

HC: 23.1cm  25w   1d

AC: 21.1cm  25w   4d

FL: 4.7cm  25w   3d

Current Mean GA: 25w 3d              US EDC: 07/23/2017

FETAL ANATOMY

Lateral Ventricles: Appears normal

Thalami/CSP: Appears normal

Posterior Fossa:  Appears normal

Nuchal Region: Appears normal

Upper Lip: Appears normal

Spine: Appears normal

4 Chamber Heart on Left: Appears normal

LVOT: Appears normal

RVOT: Appears normal

Stomach on Left: Appears normal

3 Vessel Cord: Appears normal

Cord Insertion site: Appears normal

Kidneys: Appears normal

Bladder: Appears normal

Extremities: Appears normal

Sex: Male

Technically difficult due to: Fetal position

Maternal Findings:

Cervix:  Normal cervical length measuring 4.2 cm.  Cervix is closed.

No adnexal mass.
IMPRESSION: 1. Single live intrauterine pregnancy as detailed above.

## 2020-09-15 ENCOUNTER — Ambulatory Visit (LOCAL_COMMUNITY_HEALTH_CENTER): Payer: Self-pay | Admitting: Family Medicine

## 2020-09-15 ENCOUNTER — Other Ambulatory Visit: Payer: Self-pay

## 2020-09-15 ENCOUNTER — Encounter: Payer: Self-pay | Admitting: Family Medicine

## 2020-09-15 VITALS — BP 120/81 | Ht 67.0 in | Wt 191.4 lb

## 2020-09-15 DIAGNOSIS — Z1272 Encounter for screening for malignant neoplasm of vagina: Secondary | ICD-10-CM

## 2020-09-15 DIAGNOSIS — Z3009 Encounter for other general counseling and advice on contraception: Secondary | ICD-10-CM

## 2020-09-15 DIAGNOSIS — Z01419 Encounter for gynecological examination (general) (routine) without abnormal findings: Secondary | ICD-10-CM

## 2020-09-15 DIAGNOSIS — Z3046 Encounter for surveillance of implantable subdermal contraceptive: Secondary | ICD-10-CM

## 2020-09-15 NOTE — Progress Notes (Signed)
[facility] DEPARTMENT [facility] [address] Main Number: [phone]    Family Planning Visit- Initial Visit  Subjective:  Kaitlin Hanson is a 35 y.o.  [id number]   being seen today for an initial annual visit and to discuss contraceptive options.  The patient is currently using Nexplanon for pregnancy prevention. Patient reports she does not want a pregnancy in the next year.  Patient has the following medical conditions has Pregnancy; Anemia in pregnancy; Indication for care in labor or delivery; and Indication for care or intervention in labor or delivery on their problem list.  Chief Complaint  Patient presents with   Annual Exam    PE and Pap    Patient reports here for physical   Patient denies any concerns for STI testing    Body mass index is 29.98 kg/m. - Patient is eligible for diabetes screening based on BMI and age >68?  not applicable HA1C ordered? not applicable  Patient reports 1  partner/s in last year. Desires STI screening?  No - delined   Has patient been screened once for HCV in the past?  No  No results found for: HCVAB  Does the patient have current drug use (including MJ), have a partner with drug use, and/or has been incarcerated since last result? No  If yes-- Screen for HCV through [facility]   Does the patient meet criteria for HBV testing? No  Criteria:  -Household, sexual or needle sharing contact with HBV -History of drug use -HIV positive -Those with known Hep C   Health Maintenance Due  Topic Date Due   Hepatitis C Screening  Never done   TETANUS/TDAP  Never done   PAP SMEAR-Modifier  Never done    Review of Systems  Constitutional:  Negative for chills, fever, malaise/fatigue and weight loss.  HENT:  Negative for congestion, hearing loss and sore throat.   Eyes:  Negative for blurred vision, double vision and photophobia.  Respiratory:  Negative for shortness of breath.    Cardiovascular:  Negative for chest pain.  Gastrointestinal:  Negative for abdominal pain, blood in stool, constipation, diarrhea, heartburn, nausea and vomiting.  Genitourinary:  Negative for dysuria and frequency.  Musculoskeletal:  Negative for back pain, joint pain and neck pain.  Skin:  Negative for itching and rash.  Neurological:  Negative for dizziness, weakness and headaches.  Endo/Heme/Allergies:  Does not bruise/bleed easily.  Psychiatric/Behavioral:  Negative for depression, substance abuse and suicidal ideas.    The following portions of the patient's history were reviewed and updated as appropriate: allergies, current medications, past family history, past medical history, past social history, past surgical history and problem list. Problem list updated.   See flowsheet for other program required questions.  Objective:   Vitals:   09/15/20 1410  BP: 120/81  Weight: 191 lb 6.4 oz (86.8 kg)  Height: 5\' 7"  (1.702 m)    Physical Exam Vitals and nursing note reviewed.  Constitutional:      Appearance: Normal appearance.  HENT:     Head: Normocephalic and atraumatic.     Mouth/Throat:     Mouth: Mucous membranes are moist.     Dentition: Normal dentition. No dental caries.     Pharynx: No oropharyngeal exudate or posterior oropharyngeal erythema.  Eyes:     General: No scleral icterus. Neck:     Thyroid: No thyroid mass or thyromegaly.  Cardiovascular:     Rate and Rhythm: Normal rate and  regular rhythm.     Pulses: Normal pulses.     Heart sounds: Normal heart sounds.  Pulmonary:     Effort: Pulmonary effort is normal.     Breath sounds: Normal breath sounds.  Chest:     Comments: Breasts:        Right: Normal. No swelling, mass, nipple discharge, skin change or tenderness.        Left: Normal. No swelling, mass, nipple discharge, skin change or tenderness.   Abdominal:     General: Abdomen is flat. Bowel sounds are normal.     Palpations: Abdomen is  soft.  Genitourinary:    General: Normal vulva.     Rectum: Normal.     Comments: External genitalia without, lice, nits, erythema, edema , lesions or inguinal adenopathy. Vagina with normal mucosa and white discharge and pH equals 4.  Cervix without visual lesions, uterus firm, mobile, non-tender, no masses, CMT adnexal fullness or tenderness.   Musculoskeletal:        General: Normal range of motion.     Cervical back: Normal range of motion and neck supple.  Skin:    General: Skin is warm and dry.  Neurological:     General: No focal deficit present.     Mental Status: She is alert and oriented to person, place, and time.  Psychiatric:        Behavior: Behavior normal.      Assessment and Plan:  Kaitlin Hanson is a 35 y.o. female presenting to the [facility] Department for an initial annual wellness/contraceptive visit   1. Smear, vaginal, as part of routine gynecological examination Well woman exam  CBE  PAP today   - IGP, Aptima HPV  2. Family planning counseling Contraception counseling: Reviewed all forms of birth control options in the tiered based approach. available including abstinence; over the counter/barrier methods; hormonal contraceptive medication including pill, patch, ring, injection,contraceptive implant, ECP; hormonal and nonhormonal IUDs; permanent sterilization options including vasectomy and the various tubal sterilization modalities. Risks, benefits, and typical effectiveness rates were reviewed.  Questions were answered.  Written information was also given to the patient to review.  Patient has nexplanon. She will follow up in  1 year for surveillance and removal .  She was told to call with any further questions, or with any concerns about this method of contraception.  Emphasized use of condoms 100% of the time for STI prevention.  Patient was not offered ECP based on current BCM.    M. [place] used for Spanish interpretation.      Return in about 1 year (around 09/15/2021) for annual well woman exam.  No future appointments.  Wendi Snipes, FNP

## 2020-09-15 NOTE — Progress Notes (Signed)
Pap result pending. Pt declined condoms. Provider orders completed.

## 2020-09-15 NOTE — Progress Notes (Signed)
Discussed with patient that nexplanon is good for 1 more year.  Patient decided to keep nexplanon until expiration of next year.   Wendi Snipes, FNP

## 2020-09-18 LAB — IGP, APTIMA HPV
HPV Aptima: NEGATIVE
PAP Smear Comment: 0

## 2021-10-20 ENCOUNTER — Encounter: Payer: Self-pay | Admitting: Nurse Practitioner

## 2021-10-20 ENCOUNTER — Ambulatory Visit: Payer: Self-pay

## 2021-10-20 ENCOUNTER — Ambulatory Visit (LOCAL_COMMUNITY_HEALTH_CENTER): Payer: Self-pay | Admitting: Nurse Practitioner

## 2021-10-20 VITALS — BP 110/74 | HR 98 | Temp 98.8°F | Wt 190.6 lb

## 2021-10-20 DIAGNOSIS — Z3009 Encounter for other general counseling and advice on contraception: Secondary | ICD-10-CM

## 2021-10-20 DIAGNOSIS — Z0000 Encounter for general adult medical examination without abnormal findings: Secondary | ICD-10-CM

## 2021-10-20 DIAGNOSIS — Z3046 Encounter for surveillance of implantable subdermal contraceptive: Secondary | ICD-10-CM

## 2021-10-20 MED ORDER — NORGESTIM-ETH ESTRAD TRIPHASIC 0.18/0.215/0.25 MG-35 MCG PO TABS: 1.0000 | ORAL_TABLET | Freq: Every day | ORAL | 12 refills | Status: DC

## 2021-10-20 NOTE — Progress Notes (Signed)
[facility] DEPARTMENT [facility] [address] Main Number: [phone]    Family Planning Visit- Initial Visit  Subjective:  Kaitlin Hanson is a 36 y.o.  [id number]   being seen today for an initial annual visit and to discuss reproductive life planning.  The patient is currently using Hormonal Implant for pregnancy prevention. Patient reports   does not want a pregnancy in the next year.  Patient would like to have Nexplanon removed and start taking OCPs.    report they are looking for a method that provides Method they can control starting/stopping  Patient has the following medical conditions has Pregnancy; Anemia in pregnancy; Indication for care in labor or delivery; and Indication for care or intervention in labor or delivery on their problem list.  Chief Complaint  Patient presents with   Contraception    Patient reports to clinic today for a physical, Nexplanon removed, and to start taking OCPs.   Patient denies signs and symptoms   Body mass index is 29.85 kg/m. - Patient is eligible for diabetes screening based on BMI and age >56?  not applicable HA1C ordered? not applicable  Patient reports 1  partner/s in last year. Desires STI screening?  No - refused  Has patient been screened once for HCV in the past?  No  No results found for: "HCVAB"  Does the patient have current drug use (including MJ), have a partner with drug use, and/or has been incarcerated since last result? No  If yes-- Screen for HCV through [facility]   Does the patient meet criteria for HBV testing? No  Criteria:  -Household, sexual or needle sharing contact with HBV -History of drug use -HIV positive -Those with known Hep C   Health Maintenance Due  Topic Date Due   Hepatitis C Screening  Never done   TETANUS/TDAP  Never done   INFLUENZA VACCINE  10/05/2021    Review of Systems  Constitutional:  Negative for chills, fever,  malaise/fatigue and weight loss.  HENT:  Negative for congestion, hearing loss and sore throat.   Eyes:  Negative for blurred vision, double vision and photophobia.  Respiratory:  Negative for shortness of breath.   Cardiovascular:  Negative for chest pain.  Gastrointestinal:  Negative for abdominal pain, blood in stool, constipation, diarrhea, heartburn, nausea and vomiting.  Genitourinary:  Negative for dysuria and frequency.  Musculoskeletal:  Negative for back pain, joint pain and neck pain.  Skin:  Negative for itching and rash.  Neurological:  Negative for dizziness, weakness and headaches.  Endo/Heme/Allergies:  Does not bruise/bleed easily.  Psychiatric/Behavioral:  Negative for depression, substance abuse and suicidal ideas.     The following portions of the patient's history were reviewed and updated as appropriate: allergies, current medications, past family history, past medical history, past social history, past surgical history and problem list. Problem list updated.   See flowsheet for other program required questions.  Objective:   Vitals:   10/20/21 1543  BP: 110/74  Pulse: 98  Temp: 98.8 F (37.1 C)  Weight: 190 lb 9.6 oz (86.5 kg)    Physical Exam Constitutional:      Appearance: Normal appearance.  HENT:     Head: Normocephalic. No abrasion, masses or laceration. Hair is normal.     Jaw: No tenderness or swelling.     Right Ear: External ear normal.     Left Ear: External ear normal.     Nose: Nose normal.  Mouth/Throat:     Lips: Pink. No lesions.     Mouth: Mucous membranes are moist. No lacerations or oral lesions.     Dentition: No dental caries.     Tongue: No lesions.     Palate: No mass and lesions.     Pharynx: No pharyngeal swelling, oropharyngeal exudate, posterior oropharyngeal erythema or uvula swelling.     Tonsils: No tonsillar exudate or tonsillar abscesses.  Eyes:     Pupils: Pupils are equal, round, and reactive to light.  Neck:      Thyroid: No thyroid mass, thyromegaly or thyroid tenderness.  Cardiovascular:     Rate and Rhythm: Normal rate and regular rhythm.  Pulmonary:     Effort: Pulmonary effort is normal.     Breath sounds: Normal breath sounds.  Chest:  Breasts:    Right: Normal. No swelling, mass, nipple discharge, skin change or tenderness.     Left: Normal. No swelling, mass, nipple discharge, skin change or tenderness.  Abdominal:     General: Abdomen is flat. Bowel sounds are normal.     Palpations: Abdomen is soft.     Tenderness: There is no abdominal tenderness. There is no rebound.  Genitourinary:    Comments: Deferred, declines genital exam today  Musculoskeletal:     Cervical back: Full passive range of motion without pain and normal range of motion.  Lymphadenopathy:     Cervical: No cervical adenopathy.     Right cervical: No superficial, deep or posterior cervical adenopathy.    Left cervical: No superficial, deep or posterior cervical adenopathy.     Upper Body:     Right upper body: No supraclavicular, axillary or epitrochlear adenopathy.     Left upper body: No supraclavicular, axillary or epitrochlear adenopathy.  Skin:    General: Skin is warm and dry.     Findings: No erythema, laceration, lesion or rash.  Neurological:     Mental Status: She is alert and oriented to person, place, and time.  Psychiatric:        Attention and Perception: Attention normal.        Mood and Affect: Mood normal.        Speech: Speech normal.        Behavior: Behavior normal. Behavior is cooperative.       Assessment and Plan:  Kaitlin Hanson is a 36 y.o. female presenting to the [facility] Department for an initial annual wellness/contraceptive visit  Contraception counseling: Reviewed options based on patient desire and reproductive life plan. Patient is interested in Oral Contraceptive. This was provided to the patient today.   Risks, benefits, and typical  effectiveness rates were reviewed.  Questions were answered.  Written information was also given to the patient to review.    The patient will follow up in  1 years for surveillance.  The patient was told to call with any further questions, or with any concerns about this method of contraception.  Emphasized use of condoms 100% of the time for STI prevention.  Need for ECP was assessed. ECP not offered due to continue use of birth control.    1. Nexplanon removal -See Nexplanon removal note.   2. Family planning counseling -36 year old female in clinic today for a physical, Nexplanon removal, and to start OCPs.  -Patient reports no complaints. -STD screening offered, patient declines.  -Patient also given information about vasectomies.   - Norgestimate-Ethinyl Estradiol Triphasic (TRI-SPRINTEC) 0.18/0.215/0.25 MG-35 MCG tablet; Take  1 tablet by mouth daily.  Dispense: 28 tablet; Refill: 12  3. Well woman exam (no gynecological exam) -Normal well woman exam. -CBE today, next due 10/2024 -PAP due 09/2023  Total time spent: 40 minutes    Return in about 1 year (around 10/21/2022) for Annual well-woman exam.    Glenna Fellows, FNP

## 2021-10-20 NOTE — Progress Notes (Addendum)
[facility] Spanish Interpretor utilized. Nexplanon Post Removal Instructions handout given. Family Special educational needs teacher given in  Spanish: Healthy Habits, condoms, Radio producer List , Scientist, clinical (histocompatibility and immunogenetics), [facility] Social Worker Card given. Patient declined STD screening.  The patient was dispensed Oral Contraceptives. I provided counseling today regarding the medication. We discussed the medication, the side effects and when to call clinic. Patient given the opportunity to ask questions. Questions answered. Only 12 packets/months given due to expiration dates. Patient instructed to come in to clinic once 12 th month taken to receive packet #13. taken B. Venetia Constable

## 2021-10-20 NOTE — Progress Notes (Signed)
Nexplanon Removal ?Patient identified, informed consent performed, consent signed.   Appropriate time out taken. Nexplanon site identified.  Area prepped in usual sterile fashon. 3 ml of 1% lidocaine with Epinephrine was used to anesthetize the area at the distal end of the implant and along implant site. A small stab incision was made right beside the implant on the distal portion.  The Nexplanon rod was grasped using hemostats/manual and removed without difficulty.  There was minimal blood loss. There were no complications.  Steri-strips were applied over the small incision.  A pressure bandage was applied to reduce any bruising.  The patient tolerated the procedure well and was given post procedure instructions.   Margree Gimbel, FNP  ?

## 2022-06-08 ENCOUNTER — Ambulatory Visit (LOCAL_COMMUNITY_HEALTH_CENTER): Payer: Self-pay

## 2022-06-08 VITALS — BP 119/79 | Ht 63.0 in | Wt 190.0 lb

## 2022-06-08 DIAGNOSIS — Z3201 Encounter for pregnancy test, result positive: Secondary | ICD-10-CM

## 2022-06-08 LAB — PREGNANCY, URINE: Preg Test, Ur: POSITIVE — AB

## 2022-06-08 MED ORDER — PRENATAL VITAMINS 28-0.8 MG PO TABS: 28.0000 mg | ORAL_TABLET | Freq: Every day | ORAL | 0 refills | Status: AC

## 2022-06-08 NOTE — Progress Notes (Signed)
UPT positive.  M. [place] interpreter.  Pt states was on birth control pills and stopped in December because she had the flu.  States she does not want to be pregnant and unsure of plans.  Positive pregnancy packet given and reviewed with pt.; Resources included.  Also provided contraceptive information pamphlet.  The patient was dispensed prenatal vitamins today. I provided counseling today regarding the medication.  Patient given the opportunity to ask questions. Questions answered.     Tonny Branch, RN

## 2022-12-28 ENCOUNTER — Ambulatory Visit (LOCAL_COMMUNITY_HEALTH_CENTER): Payer: Self-pay

## 2022-12-28 VITALS — BP 122/66 | Ht 63.0 in | Wt 187.0 lb

## 2022-12-28 DIAGNOSIS — Z3009 Encounter for other general counseling and advice on contraception: Secondary | ICD-10-CM

## 2022-12-28 DIAGNOSIS — Z3201 Encounter for pregnancy test, result positive: Secondary | ICD-10-CM

## 2022-12-28 LAB — PREGNANCY, URINE: Preg Test, Ur: POSITIVE — AB

## 2022-12-28 MED ORDER — PRENATAL 27-0.8 MG PO TABS: 1.0000 | ORAL_TABLET | Freq: Every day | ORAL | Status: AC

## 2022-12-28 NOTE — Progress Notes (Signed)
UPT positive. Plans care at [facility]; sent to clerk for preadmit and set-up of presumptive eligibility.   The patient was dispensed prenatal vitamins #100 today. I provided counseling today regarding the medication. We discussed the medication, the side effects and when to call clinic.   Positive pregnancy packet reviewed and given to patient. Also counseled on hydration and when to seek medical attention.   Patient given the opportunity to ask questions. Questions answered.   Interpreter, Marlene Yemen, helped during this visit.   Abagail Kitchens, RN

## 2023-01-12 ENCOUNTER — Ambulatory Visit: Payer: Medicaid Other | Admitting: Physician Assistant

## 2023-01-12 ENCOUNTER — Encounter: Payer: Self-pay | Admitting: Physician Assistant

## 2023-01-12 VITALS — BP 110/77 | HR 82 | Temp 97.6°F | Wt 177.2 lb

## 2023-01-12 DIAGNOSIS — O09529 Supervision of elderly multigravida, unspecified trimester: Secondary | ICD-10-CM | POA: Insufficient documentation

## 2023-01-12 DIAGNOSIS — Z23 Encounter for immunization: Secondary | ICD-10-CM

## 2023-01-12 DIAGNOSIS — O0932 Supervision of pregnancy with insufficient antenatal care, second trimester: Secondary | ICD-10-CM | POA: Diagnosis not present

## 2023-01-12 DIAGNOSIS — Z3A18 18 weeks gestation of pregnancy: Secondary | ICD-10-CM | POA: Diagnosis not present

## 2023-01-12 DIAGNOSIS — O0992 Supervision of high risk pregnancy, unspecified, second trimester: Secondary | ICD-10-CM

## 2023-01-12 DIAGNOSIS — O99212 Obesity complicating pregnancy, second trimester: Secondary | ICD-10-CM

## 2023-01-12 DIAGNOSIS — O0930 Supervision of pregnancy with insufficient antenatal care, unspecified trimester: Secondary | ICD-10-CM | POA: Insufficient documentation

## 2023-01-12 DIAGNOSIS — O0990 Supervision of high risk pregnancy, unspecified, unspecified trimester: Secondary | ICD-10-CM | POA: Insufficient documentation

## 2023-01-12 DIAGNOSIS — O09522 Supervision of elderly multigravida, second trimester: Secondary | ICD-10-CM

## 2023-01-12 DIAGNOSIS — O99210 Obesity complicating pregnancy, unspecified trimester: Secondary | ICD-10-CM | POA: Insufficient documentation

## 2023-01-12 LAB — HEMOGLOBIN, FINGERSTICK: Hemoglobin: 12.6 g/dL (ref 11.1–15.9)

## 2023-01-12 MED ORDER — ASPIRIN 81 MG PO TBEC: 81.0000 mg | DELAYED_RELEASE_TABLET | Freq: Every day | ORAL | Status: AC

## 2023-01-12 NOTE — Progress Notes (Addendum)
Presents for initiation of prenatal care and denies medical care thus far in pregnancy. Born in [place] and arrived to the [place] in 2000 with no international travel since that time. Prior to initiation of [facility] IP paperwork, client spontaneously erupted into tears and took ~ 5 minutes for her to become composed. Client states her partner is with someone else. EPDS score = 4. Desires MaterniT-21 and AFP testing today. Client reports SAB in 2016 and 2024. No medical evaluation for either SAB.  Arm measurement for BP cuff = 31 cm. Adult cuff BP =  121/85. Large cuff BP = 11/77. Jossie Ng, RN Hgb = 12.6 and no intervention required per standing order. Jossie Ng, RN Anatomy US scheduled for 02/08/23 at 1000 with arrival time of 0945. Appt reminder given to client along with facility address. Jossie Ng, RN

## 2023-01-12 NOTE — Progress Notes (Signed)
[facility]  Maternal Health Clinic   INITIAL PRENATAL VISIT NOTE  Subjective:  Kaitlin Hanson is a 37 y.o. [id number] at [redacted]w[redacted]d being seen today to start prenatal care at the [facility] Department.  She is currently monitored for the following issues for this high-risk pregnancy and has Pregnancy; Anemia in pregnancy; Indication for care in labor or delivery; Indication for care or intervention in labor or delivery; Obesity in pregnancy; Supervision of high-risk pregnancy, unspecified trimester; Antepartum multigravida of advanced maternal age; and Late prenatal care affecting pregnancy, antepartum on their problem list.  Patient reports no complaints.  Contractions: Not present. Vag. Bleeding: None.  Movement: Absent. Denies leaking of fluid.   Indications for ASA therapy (per uptodate) One of the following: Previous pregnancy with preeclampsia, especially early onset and with an adverse outcome No Multifetal gestation No Chronic hypertension No Type 1 or 2 diabetes mellitus No Chronic kidney disease No Autoimmune disease (antiphospholipid syndrome, systemic lupus erythematosus) No  Two or more of the following: Nulliparity No Obesity (body mass index >30 kg/m2) Yes Family history of preeclampsia in mother or sister No Age >=35 years Yes Sociodemographic characteristics (African American race, low socioeconomic level) Yes Personal risk factors (eg, previous pregnancy with low birth weight or small for gestational age infant, previous adverse pregnancy outcome [eg, stillbirth], interval >10 years between pregnancies) No   The following portions of the patient's history were reviewed and updated as appropriate: allergies, current medications, past family history, past medical history, past social history, past surgical history and problem list. Problem list updated.  Objective:   Vitals:   01/12/23 0842  BP: 110/77  Pulse: 82  Temp: 97.6 F  (36.4 C)  Weight: 177 lb 3.2 oz (80.4 kg)    Fetal Status: Fetal Heart Rate (bpm): 147 Fundal Height: 17 cm Movement: Absent  Presentation: Undeterminable  Physical Exam Vitals and nursing note reviewed. Exam conducted with a chaperone present [facility] [place], interpreter).  Constitutional:      General: She is not in acute distress.    Appearance: Normal appearance. She is well-developed. She is obese.  HENT:     Head: Normocephalic and atraumatic.     Right Ear: External ear normal.     Left Ear: External ear normal.     Nose: Nose normal. No congestion or rhinorrhea.     Mouth/Throat:     Lips: Pink.     Mouth: Mucous membranes are moist.     Dentition: Normal dentition. No dental caries.     Pharynx: Oropharynx is clear. Uvula midline.     Comments: Dentition: good Eyes:     General: No scleral icterus.    Conjunctiva/sclera: Conjunctivae normal.  Neck:     Thyroid: No thyroid mass or thyromegaly.  Cardiovascular:     Rate and Rhythm: Normal rate and regular rhythm.     Pulses: Normal pulses.     Heart sounds: Normal heart sounds.     Comments: Extremities are warm and well perfused Pulmonary:     Effort: Pulmonary effort is normal.     Breath sounds: Normal breath sounds.  Chest:     Chest wall: No mass.  Breasts:    Tanner Score is 5.     Breasts are symmetrical.     Right: Normal. No mass, nipple discharge or skin change.     Left: Normal. No mass, nipple discharge or skin change.  Abdominal:     General: Abdomen is flat.  Palpations: Abdomen is soft.     Tenderness: There is no abdominal tenderness.     Comments: Gravid   Genitourinary:    General: Normal vulva.     Exam position: Lithotomy position.     Pubic Area: No rash.      Labia:        Right: No rash.        Left: No rash.      Vagina: Normal. No vaginal discharge.     Cervix: No cervical motion tenderness or friability.     Uterus: Normal. Enlarged (Gravid 18-20 week size). Not tender.       Adnexa: Right adnexa normal and left adnexa normal.     Rectum: Normal. No external hemorrhoid.  Musculoskeletal:     Right lower leg: No edema.     Left lower leg: No edema.  Lymphadenopathy:     Cervical: No cervical adenopathy.     Upper Body:     Right upper body: No axillary adenopathy.     Left upper body: No axillary adenopathy.  Skin:    General: Skin is warm.     Capillary Refill: Capillary refill takes less than 2 seconds.  Neurological:     Mental Status: She is alert.    Assessment and Plan:  Pregnancy: [id number] at [redacted]w[redacted]d  1. Supervision of high-risk pregnancy, unspecified trimester Schedule routine fetal anatomy US in about 2 weeks. Routine labs today. Start daily low-dose aspirin and continue until delivery. - Prenatal Profile I - Lead, blood (adult age 84 yrs or greater) - Hgb Fractionation Cascade - QuantiFERON-TB Gold Plus - Comprehensive metabolic panel - Glucose, 1 hour - Hgb A1c w/o eAG - TSH - Protein / creatinine ratio, urine - MaterniT 21 plus Core, Blood - AFP, Serum, Open Spina Bifida - Hemoglobin, venipuncture - aspirin EC 81 MG tablet; Take 1 tablet (81 mg total) by mouth daily. Swallow whole. - [place] MFM OB DETAIL +14 WK; Future  2. Obesity in pregnancy Baseline labs today, start low-dose aspirin, and continue until delivery. Recommend 11-20 lb TWG. Refer to nutrition for MNT> - Amb ref to Medical Nutrition Therapy-MNT  3. [redacted] weeks gestation of pregnancy RV in 4 weeks.  4. Antepartum multigravida of advanced maternal age Start daily low-dose aspirin. Declines genetic counseling referral but desires Mat 21 and ONTD testing today.   5. Late prenatal care affecting pregnancy, antepartum Refer to [facility].  Discussed overview of care and coordination with inpatient delivery practices including [facility],  [facility].    Preterm labor symptoms and general obstetric precautions including but not limited to vaginal  bleeding, contractions, leaking of fluid and fetal movement were reviewed in detail with the patient.  Please refer to After Visit Summary for other counseling recommendations.  Due to language barrier, an interpreter (M. [place]) was present during the history-taking and subsequent discussion (and for the physical exam) with this patient.   Return in about 4 weeks (around 02/09/2023) for Routine prenatal care.  Future Appointments  Date Time Provider Department Center  02/08/2023 10:00 AM [facility]  02/09/2023  8:40 AM [facility] PROVIDER [facility] None    Landry Dyke, PA-C

## 2023-01-13 LAB — PROTEIN / CREATININE RATIO, URINE
Creatinine, Urine: 178.6 mg/dL
Protein, Ur: 18.2 mg/dL
Protein/Creat Ratio: 102 mg/g{creat} (ref 0–200)

## 2023-01-18 LAB — PREGNANCY, INITIAL SCREEN
Antibody Screen: NEGATIVE
Basophils Absolute: 0 10*3/uL (ref 0.0–0.2)
Basos: 0 %
Bilirubin, UA: NEGATIVE
Chlamydia trachomatis, NAA: NEGATIVE
EOS (ABSOLUTE): 0 10*3/uL (ref 0.0–0.4)
Eos: 0 %
Glucose, UA: NEGATIVE
HCV Ab: NONREACTIVE
HIV Screen 4th Generation wRfx: NONREACTIVE
Hematocrit: 38.6 % (ref 34.0–46.6)
Hemoglobin: 12.5 g/dL (ref 11.1–15.9)
Hepatitis B Surface Ag: NEGATIVE
Immature Grans (Abs): 0 10*3/uL (ref 0.0–0.1)
Immature Granulocytes: 0 %
Lymphocytes Absolute: 2.1 10*3/uL (ref 0.7–3.1)
Lymphs: 23 %
MCH: 29.3 pg (ref 26.6–33.0)
MCHC: 32.4 g/dL (ref 31.5–35.7)
MCV: 91 fL (ref 79–97)
Monocytes Absolute: 0.3 10*3/uL (ref 0.1–0.9)
Monocytes: 4 %
Neisseria Gonorrhoeae by PCR: NEGATIVE
Neutrophils Absolute: 6.8 10*3/uL (ref 1.4–7.0)
Neutrophils: 73 %
Nitrite, UA: NEGATIVE
Platelets: 244 10*3/uL (ref 150–450)
RBC, UA: NEGATIVE
RBC: 4.26 x10E6/uL (ref 3.77–5.28)
RDW: 14.4 % (ref 11.7–15.4)
RPR Ser Ql: NONREACTIVE
Rh Factor: POSITIVE
Rubella Antibodies, IGG: 5.98 {index} (ref >0.99)
Specific Gravity, UA: 1.018 (ref 1.005–1.030)
Urobilinogen, Ur: 0.2 mg/dL (ref 0.2–1.0)
WBC: 9.3 10*3/uL (ref 3.4–10.8)
pH, UA: 6 (ref 5.0–7.5)

## 2023-01-18 LAB — MATERNIT 21 PLUS CORE, BLOOD
Fetal Fraction: 21
Result (T21): NEGATIVE
Trisomy 13 (Patau syndrome): NEGATIVE
Trisomy 18 (Edwards syndrome): NEGATIVE
Trisomy 21 (Down syndrome): NEGATIVE

## 2023-01-18 LAB — AFP, SERUM, OPEN SPINA BIFIDA
AFP MoM: 1.06
AFP Value: 44.1 ng/mL
Gest. Age on Collection Date: 18.6 wk
Maternal Age At EDD: 38 a
OSBR Risk 1 IN: 10000
Test Results:: NEGATIVE
Weight: 177 [lb_av]

## 2023-01-18 LAB — COMPREHENSIVE METABOLIC PANEL
ALT: 24 [IU]/L (ref 0–32)
AST: 23 [IU]/L (ref 0–40)
Albumin: 3.8 g/dL — ABNORMAL LOW (ref 3.9–4.9)
Alkaline Phosphatase: 98 [IU]/L (ref 44–121)
BUN/Creatinine Ratio: 7 — ABNORMAL LOW (ref 9–23)
BUN: 4 mg/dL — ABNORMAL LOW (ref 6–20)
Bilirubin Total: 0.2 mg/dL (ref 0.0–1.2)
CO2: 19 mmol/L — ABNORMAL LOW (ref 20–29)
Calcium: 8.9 mg/dL (ref 8.7–10.2)
Chloride: 100 mmol/L (ref 96–106)
Creatinine, Ser: 0.57 mg/dL (ref 0.57–1.00)
Globulin, Total: 2.8 g/dL (ref 1.5–4.5)
Glucose: 202 mg/dL — ABNORMAL HIGH (ref 70–99)
Potassium: 3.4 mmol/L — ABNORMAL LOW (ref 3.5–5.2)
Sodium: 133 mmol/L — ABNORMAL LOW (ref 134–144)
Total Protein: 6.6 g/dL (ref 6.0–8.5)
eGFR: 120 mL/min/{1.73_m2} (ref >59)

## 2023-01-18 LAB — MICROSCOPIC EXAMINATION
Casts: NONE SEEN /[LPF]
Epithelial Cells (non renal): >10 /[HPF] — AB (ref 0–10)

## 2023-01-18 LAB — QUANTIFERON-TB GOLD PLUS
QuantiFERON Mitogen Value: >10 [IU]/mL
QuantiFERON Nil Value: 0.01 [IU]/mL
QuantiFERON TB1 Ag Value: 0.11 [IU]/mL
QuantiFERON TB2 Ag Value: 0.09 [IU]/mL
QuantiFERON-TB Gold Plus: NEGATIVE

## 2023-01-18 LAB — TSH: TSH: 1.38 u[IU]/mL (ref 0.450–4.500)

## 2023-01-18 LAB — HGB FRACTIONATION CASCADE
Hgb A2: 2.5 % (ref 1.8–3.2)
Hgb A: 97.5 % (ref 96.4–98.8)
Hgb F: 0 % (ref 0.0–2.0)
Hgb S: 0 %

## 2023-01-18 LAB — GLUCOSE, 1 HOUR GESTATIONAL: Gestational Diabetes Screen: 208 mg/dL — ABNORMAL HIGH (ref 70–139)

## 2023-01-18 LAB — URINE CULTURE, OB REFLEX

## 2023-01-18 LAB — HCV INTERPRETATION

## 2023-01-18 LAB — HGB A1C W/O EAG: Hgb A1c MFr Bld: 6.2 % — ABNORMAL HIGH (ref 4.8–5.6)

## 2023-01-18 LAB — LEAD, BLOOD (ADULT >= 16 YRS): Lead-Whole Blood: 1.3 ug/dL (ref 0.0–3.4)

## 2023-01-19 ENCOUNTER — Encounter: Payer: Self-pay | Admitting: Physician Assistant

## 2023-01-19 ENCOUNTER — Telehealth: Payer: Self-pay

## 2023-01-19 DIAGNOSIS — O2340 Unspecified infection of urinary tract in pregnancy, unspecified trimester: Secondary | ICD-10-CM | POA: Insufficient documentation

## 2023-01-19 DIAGNOSIS — B951 Streptococcus, group B, as the cause of diseases classified elsewhere: Secondary | ICD-10-CM | POA: Insufficient documentation

## 2023-01-19 DIAGNOSIS — O99810 Abnormal glucose complicating pregnancy: Secondary | ICD-10-CM | POA: Insufficient documentation

## 2023-01-19 NOTE — Telephone Encounter (Signed)
1 hour GTT = 202 and client needs 3 hour GTT. Call to client and counseled on 1 hour GTT test results and the need for a 3 hour GTT. Test prep instructions explained to client with stated understanding. Client is able to come in am for testing and OB Problem appt scheduled. Jossie Ng, RN

## 2023-01-20 ENCOUNTER — Ambulatory Visit: Payer: Medicaid Other | Admitting: Advanced Practice Midwife

## 2023-01-20 VITALS — BP 98/64 | HR 73 | Temp 98.1°F | Wt 178.0 lb

## 2023-01-20 DIAGNOSIS — O0992 Supervision of high risk pregnancy, unspecified, second trimester: Secondary | ICD-10-CM

## 2023-01-20 DIAGNOSIS — O2342 Unspecified infection of urinary tract in pregnancy, second trimester: Secondary | ICD-10-CM

## 2023-01-20 DIAGNOSIS — O99810 Abnormal glucose complicating pregnancy: Secondary | ICD-10-CM

## 2023-01-20 DIAGNOSIS — B951 Streptococcus, group B, as the cause of diseases classified elsewhere: Secondary | ICD-10-CM

## 2023-01-20 DIAGNOSIS — O99210 Obesity complicating pregnancy, unspecified trimester: Secondary | ICD-10-CM

## 2023-01-20 DIAGNOSIS — O99212 Obesity complicating pregnancy, second trimester: Secondary | ICD-10-CM | POA: Diagnosis not present

## 2023-01-20 DIAGNOSIS — O09529 Supervision of elderly multigravida, unspecified trimester: Secondary | ICD-10-CM

## 2023-01-20 DIAGNOSIS — O0930 Supervision of pregnancy with insufficient antenatal care, unspecified trimester: Secondary | ICD-10-CM

## 2023-01-20 DIAGNOSIS — O0932 Supervision of pregnancy with insufficient antenatal care, second trimester: Secondary | ICD-10-CM

## 2023-01-20 DIAGNOSIS — O09522 Supervision of elderly multigravida, second trimester: Secondary | ICD-10-CM

## 2023-01-20 MED ORDER — AMOXICILLIN-POT CLAVULANATE 875-125 MG PO TABS: 1.0000 | ORAL_TABLET | Freq: Two times a day (BID) | ORAL | Status: DC

## 2023-01-20 MED ORDER — AMOXICILLIN-POT CLAVULANATE 875-125 MG PO TABS: 1.0000 | ORAL_TABLET | Freq: Two times a day (BID) | ORAL | Status: AC

## 2023-01-20 NOTE — Progress Notes (Addendum)
[facility] Maternal Health Clinic  PRENATAL VISIT NOTE  Subjective:  Kaitlin Hanson is a 37 y.o. [id number] at [redacted]w[redacted]d being seen today for ongoing prenatal care.  She is currently monitored for the following issues for this high-risk pregnancy and has Obesity in pregnancy - pregrav BMI 33; Supervision of high-risk pregnancy, unspecified trimester; Antepartum multigravida of advanced maternal age - 59 at [facility]; Late prenatal care affecting pregnancy, antepartum, start at [redacted]w[redacted]d; Abnormal glucose in pregnancy, antepartum; UTI in pregnancy; and Positive GBS test (urine 01/12/23) on their problem list.  Patient reports no complaints.  Contractions: Not present. Vag. Bleeding: None.  Movement: Present. Denies leaking of fluid/ROM.   The following portions of the patient's history were reviewed and updated as appropriate: allergies, current medications, past family history, past medical history, past social history, past surgical history and problem list. Problem list updated.  Objective:   Vitals:   01/20/23 0827  BP: 98/64  Pulse: 73  Temp: 98.1 F (36.7 C)  Weight: 178 lb (80.7 kg)    Fetal Status: Fetal Heart Rate (bpm): 145   Movement: Present     General:  Alert, oriented and cooperative. Patient is in no acute distress.  Skin: Skin is warm and dry. No rash noted.   Cardiovascular: Normal heart rate noted  Respiratory: Normal respiratory effort, no problems with respiration noted  Abdomen: Soft, gravid, appropriate for gestational age.  Pain/Pressure: Absent     Pelvic: Cervical exam deferred        Extremities: Normal range of motion.  Edema: None  Mental Status: Normal mood and affect. Normal behavior. Normal judgment and thought content.   Assessment and Plan:  Pregnancy: [id number] at [redacted]w[redacted]d  1. Obesity in pregnancy - pregrav BMI 33 Taking ASA 81 mg daily  2. Antepartum multigravida of advanced maternal age - 89 at [facility] Declines genetic  counseling Taking ASA 81 mg daily NIPS neg 01/12/23 AFP only neg 01/12/23  3. Late prenatal care affecting pregnancy, antepartum, start at [redacted]w[redacted]d   4. Abnormal glucose in pregnancy, antepartum 3 hour GTT today - Gestational Glucose Tolerance  5. Urinary tract infection in mother during second trimester of pregnancy Treated today with Augmentin 875-125 BID x 7 days Will need TOC after tx completed Drinks 1 c. Coffee/day+1 glass Pepsi/day--counseled to d/c -CVAT The patient was dispensed Augmentin 875-125 mg today. I provided counseling today regarding the medication. We discussed the medication, the side effects and when to call clinic. Patient given the opportunity to ask questions. Questions answered.    - amoxicillin-clavulanate (AUGMENTIN) 875-125 MG tablet; Take 1 tablet by mouth 2 (two) times daily.  6. Positive GBS test (urine 01/12/23) Treated today with Augmentin 875-125 BID x 7 days Will need TOC after tx completed   Preterm labor symptoms and general obstetric precautions including but not limited to vaginal bleeding, contractions, leaking of fluid and fetal movement were reviewed in detail with the patient. Please refer to After Visit Summary for other counseling recommendations.  Return in about 4 weeks (around 02/17/2023) for routine PNC.  Future Appointments  Date Time Provider Department Center  02/08/2023 10:00 AM [facility]  02/09/2023  8:40 AM [facility] PROVIDER [facility] None    Alberteen Spindle, CNM

## 2023-01-20 NOTE — Progress Notes (Signed)
Chart reviewed by Pharmacist  Suzanne Walker PharmD, Contract Pharmacist at [facility]  

## 2023-01-20 NOTE — Progress Notes (Signed)
Patient here for 3 hour gtt and UTI treatment. Sent to lab first to start 3 hour gtt. Will return to clinic for UTI treatment.Burt Knack, RN

## 2023-01-21 LAB — GESTATIONAL GLUCOSE TOLERANCE
Glucose, Fasting: 81 mg/dL (ref 70–94)
Glucose, GTT - 1 Hour: 188 mg/dL — ABNORMAL HIGH (ref 70–179)
Glucose, GTT - 2 Hour: 162 mg/dL — ABNORMAL HIGH (ref 70–154)
Glucose, GTT - 3 Hour: 86 mg/dL (ref 70–139)

## 2023-01-23 ENCOUNTER — Telehealth: Payer: Self-pay

## 2023-01-23 ENCOUNTER — Encounter: Payer: Self-pay | Admitting: Advanced Practice Midwife

## 2023-01-23 DIAGNOSIS — E119 Type 2 diabetes mellitus without complications: Secondary | ICD-10-CM

## 2023-01-23 HISTORY — DX: Type 2 diabetes mellitus without complications: E11.9

## 2023-01-23 NOTE — Telephone Encounter (Signed)
Call to client per E. Sciora CNM and counseled regarding diabetes and need to receive prenatal care at [facility]. Understanding verbalized. Client counseled to keep 02/09/23 [facility] RV appt if does not have [facility] appt prior to that date. Also counseled [facility] could provide her with no cost initial  diabetic testing supplies that she can pick up. Roddie Mc [place] interpreted during phone call. Jossie Ng, RN

## 2023-02-08 ENCOUNTER — Other Ambulatory Visit: Payer: Self-pay

## 2023-02-09 ENCOUNTER — Ambulatory Visit: Payer: Medicaid Other

## 2023-02-10 NOTE — Progress Notes (Addendum)
Patient walked in to clinic for diabetic supplies. Patient care has been transferred to [facility] due to early gestational diabetes. Kit #20 given to client today with 300 test strips, and patient aware that she may buy more when needed off the shelf at [facility]. Patient states she has been taught how to use supplies at [facility].Marland KitchenMarland KitchenInterpreter, M. [place].Burt Knack, RN
